# Patient Record
Sex: Male | Born: 1960 | Race: Black or African American | Hispanic: No | Marital: Single | Smoking: Current every day smoker
Health system: Southern US, Community
[De-identification: ages and names within clinical notes are randomized; demographics above are authoritative.]

## PROBLEM LIST (undated history)

## (undated) DIAGNOSIS — L03315 Cellulitis of perineum: Secondary | ICD-10-CM

## (undated) DIAGNOSIS — L02215 Cutaneous abscess of perineum: Secondary | ICD-10-CM

## (undated) DIAGNOSIS — L711 Rhinophyma: Secondary | ICD-10-CM

## (undated) DIAGNOSIS — R972 Elevated prostate specific antigen [PSA]: Secondary | ICD-10-CM

## (undated) DIAGNOSIS — F1020 Alcohol dependence, uncomplicated: Secondary | ICD-10-CM

## (undated) DIAGNOSIS — F1420 Cocaine dependence, uncomplicated: Secondary | ICD-10-CM

## (undated) DIAGNOSIS — W3400XA Accidental discharge from unspecified firearms or gun, initial encounter: Secondary | ICD-10-CM

## (undated) DIAGNOSIS — K603 Anal fistula, unspecified: Secondary | ICD-10-CM

## (undated) DIAGNOSIS — R609 Edema, unspecified: Secondary | ICD-10-CM

## (undated) DIAGNOSIS — F1220 Cannabis dependence, uncomplicated: Secondary | ICD-10-CM

## (undated) DIAGNOSIS — F329 Major depressive disorder, single episode, unspecified: Secondary | ICD-10-CM

## (undated) DIAGNOSIS — F1994 Other psychoactive substance use, unspecified with psychoactive substance-induced mood disorder: Secondary | ICD-10-CM

## (undated) DIAGNOSIS — F32A Depression, unspecified: Secondary | ICD-10-CM

## (undated) DIAGNOSIS — F4310 Post-traumatic stress disorder, unspecified: Secondary | ICD-10-CM

## (undated) DIAGNOSIS — F17200 Nicotine dependence, unspecified, uncomplicated: Secondary | ICD-10-CM

## (undated) HISTORY — DX: Alcohol dependence, uncomplicated: F10.20

## (undated) HISTORY — DX: Edema, unspecified: R60.9

## (undated) HISTORY — DX: Nicotine dependence, unspecified, uncomplicated: F17.200

## (undated) HISTORY — DX: Elevated prostate specific antigen (PSA): R97.20

## (undated) HISTORY — DX: Major depressive disorder, single episode, unspecified: F32.9

## (undated) HISTORY — DX: Other psychoactive substance use, unspecified with psychoactive substance-induced mood disorder: F19.94

## (undated) HISTORY — DX: Cellulitis of perineum: L03.315

## (undated) HISTORY — DX: Rhinophyma: L71.1

## (undated) HISTORY — DX: Cannabis dependence, uncomplicated: F12.20

## (undated) HISTORY — DX: Cutaneous abscess of perineum: L02.215

## (undated) HISTORY — DX: Anal fistula: K60.3

---

## 1998-04-27 HISTORY — PX: OTHER SURGICAL HISTORY: SHX169

## 2000-04-27 HISTORY — PX: KNEE SURGERY: SHX244

## 2004-11-08 ENCOUNTER — Emergency Department: Payer: Self-pay | Admitting: Unknown Physician Specialty

## 2004-11-08 ENCOUNTER — Other Ambulatory Visit: Payer: Self-pay

## 2004-11-25 ENCOUNTER — Emergency Department: Payer: Self-pay | Admitting: Emergency Medicine

## 2004-12-02 ENCOUNTER — Ambulatory Visit: Payer: Self-pay | Admitting: Family Medicine

## 2005-03-10 ENCOUNTER — Emergency Department: Payer: Self-pay | Admitting: Emergency Medicine

## 2005-04-09 ENCOUNTER — Emergency Department: Payer: Self-pay | Admitting: General Practice

## 2005-04-24 ENCOUNTER — Emergency Department: Payer: Self-pay | Admitting: Unknown Physician Specialty

## 2005-08-07 ENCOUNTER — Emergency Department: Payer: Self-pay | Admitting: Emergency Medicine

## 2005-08-18 ENCOUNTER — Emergency Department: Payer: Self-pay | Admitting: Emergency Medicine

## 2008-09-08 ENCOUNTER — Emergency Department: Payer: Self-pay | Admitting: Internal Medicine

## 2013-04-27 HISTORY — PX: RECTAL SURGERY: SHX760

## 2013-12-27 ENCOUNTER — Inpatient Hospital Stay: Payer: Self-pay | Admitting: Psychiatry

## 2013-12-28 LAB — BEHAVIORAL MEDICINE 1 PANEL
Albumin: 3.7 g/dL (ref 3.4–5.0)
Alkaline Phosphatase: 47 U/L
Anion Gap: 6 — ABNORMAL LOW (ref 7–16)
BASOS ABS: 0 10*3/uL (ref 0.0–0.1)
BUN: 9 mg/dL (ref 7–18)
Basophil %: 1 %
Bilirubin,Total: 0.8 mg/dL (ref 0.2–1.0)
CALCIUM: 8.5 mg/dL (ref 8.5–10.1)
CHLORIDE: 104 mmol/L (ref 98–107)
CREATININE: 0.87 mg/dL (ref 0.60–1.30)
Co2: 28 mmol/L (ref 21–32)
EGFR (Non-African Amer.): >60
Eosinophil #: 0.2 10*3/uL (ref 0.0–0.7)
Eosinophil %: 3.5 %
Glucose: 115 mg/dL — ABNORMAL HIGH (ref 65–99)
HCT: 43.2 % (ref 40.0–52.0)
HGB: 14.1 g/dL (ref 13.0–18.0)
LYMPHS ABS: 1.6 10*3/uL (ref 1.0–3.6)
Lymphocyte %: 36.4 %
MCH: 32.7 pg (ref 26.0–34.0)
MCHC: 32.7 g/dL (ref 32.0–36.0)
MCV: 100 fL (ref 80–100)
Monocyte #: 0.3 x10 3/mm (ref 0.2–1.0)
Monocyte %: 6.6 %
NEUTROS ABS: 2.3 10*3/uL (ref 1.4–6.5)
NEUTROS PCT: 52.5 %
Osmolality: 275 (ref 275–301)
Platelet: 231 10*3/uL (ref 150–440)
Potassium: 3.6 mmol/L (ref 3.5–5.1)
RBC: 4.31 10*6/uL — AB (ref 4.40–5.90)
RDW: 13 % (ref 11.5–14.5)
SGOT(AST): 18 U/L (ref 15–37)
SGPT (ALT): 18 U/L
Sodium: 138 mmol/L (ref 136–145)
Thyroid Stimulating Horm: 1.14 u[IU]/mL
Total Protein: 7.3 g/dL (ref 6.4–8.2)
WBC: 4.4 10*3/uL (ref 3.8–10.6)

## 2013-12-28 LAB — URINALYSIS, COMPLETE
BACTERIA: NONE SEEN
BILIRUBIN, UR: NEGATIVE
BLOOD: NEGATIVE
Glucose,UR: NEGATIVE mg/dL (ref 0–75)
KETONE: NEGATIVE
Leukocyte Esterase: NEGATIVE
Nitrite: NEGATIVE
Ph: 6 (ref 4.5–8.0)
Protein: NEGATIVE
RBC,UR: <1 /HPF (ref 0–5)
SQUAMOUS EPITHELIAL: NONE SEEN
Specific Gravity: 1.017 (ref 1.003–1.030)

## 2014-01-26 ENCOUNTER — Emergency Department: Payer: Self-pay | Admitting: Emergency Medicine

## 2014-01-26 LAB — DRUG SCREEN, URINE
Amphetamines, Ur Screen: NEGATIVE (ref ?–1000)
Barbiturates, Ur Screen: NEGATIVE (ref ?–200)
Benzodiazepine, Ur Scrn: NEGATIVE (ref ?–200)
COCAINE METABOLITE, UR ~~LOC~~: POSITIVE (ref ?–300)
Cannabinoid 50 Ng, Ur ~~LOC~~: POSITIVE (ref ?–50)
MDMA (ECSTASY) UR SCREEN: NEGATIVE (ref ?–500)
Methadone, Ur Screen: NEGATIVE (ref ?–300)
Opiate, Ur Screen: NEGATIVE (ref ?–300)
Phencyclidine (PCP) Ur S: NEGATIVE (ref ?–25)
TRICYCLIC, UR SCREEN: NEGATIVE (ref ?–1000)

## 2014-01-26 LAB — SALICYLATE LEVEL: Salicylates, Serum: 2.1 mg/dL

## 2014-01-26 LAB — CBC
HCT: 41.2 % (ref 40.0–52.0)
HGB: 13.3 g/dL (ref 13.0–18.0)
MCH: 32.2 pg (ref 26.0–34.0)
MCHC: 32.3 g/dL (ref 32.0–36.0)
MCV: 100 fL (ref 80–100)
Platelet: 283 10*3/uL (ref 150–440)
RBC: 4.12 10*6/uL — ABNORMAL LOW (ref 4.40–5.90)
RDW: 12.3 % (ref 11.5–14.5)
WBC: 6 10*3/uL (ref 3.8–10.6)

## 2014-01-26 LAB — COMPREHENSIVE METABOLIC PANEL
ALBUMIN: 3.9 g/dL (ref 3.4–5.0)
ALK PHOS: 52 U/L
ALT: 21 U/L
AST: 26 U/L (ref 15–37)
Anion Gap: 8 (ref 7–16)
BILIRUBIN TOTAL: 0.6 mg/dL (ref 0.2–1.0)
BUN: 10 mg/dL (ref 7–18)
CREATININE: 1 mg/dL (ref 0.60–1.30)
Calcium, Total: 8.4 mg/dL — ABNORMAL LOW (ref 8.5–10.1)
Chloride: 102 mmol/L (ref 98–107)
Co2: 30 mmol/L (ref 21–32)
EGFR (African American): >60
Glucose: 92 mg/dL (ref 65–99)
Osmolality: 278 (ref 275–301)
POTASSIUM: 3.7 mmol/L (ref 3.5–5.1)
Sodium: 140 mmol/L (ref 136–145)
TOTAL PROTEIN: 7.6 g/dL (ref 6.4–8.2)

## 2014-01-26 LAB — ACETAMINOPHEN LEVEL: Acetaminophen: <2 ug/mL

## 2014-01-26 LAB — TSH: Thyroid Stimulating Horm: 1.07 u[IU]/mL

## 2014-01-26 LAB — TROPONIN I: Troponin-I: <0.02 ng/mL

## 2014-01-26 LAB — ETHANOL: Ethanol: <3 mg/dL

## 2014-03-06 DIAGNOSIS — F17200 Nicotine dependence, unspecified, uncomplicated: Secondary | ICD-10-CM | POA: Insufficient documentation

## 2014-03-06 DIAGNOSIS — R972 Elevated prostate specific antigen [PSA]: Secondary | ICD-10-CM

## 2014-03-06 HISTORY — DX: Elevated prostate specific antigen (PSA): R97.20

## 2014-03-06 HISTORY — DX: Nicotine dependence, unspecified, uncomplicated: F17.200

## 2014-03-10 ENCOUNTER — Emergency Department: Payer: Self-pay | Admitting: Emergency Medicine

## 2014-03-12 LAB — COMPREHENSIVE METABOLIC PANEL
ALT: 26 U/L
AST: 29 U/L (ref 15–37)
Albumin: 3.7 g/dL (ref 3.4–5.0)
Alkaline Phosphatase: 51 U/L
Anion Gap: 5 — ABNORMAL LOW (ref 7–16)
BILIRUBIN TOTAL: 0.6 mg/dL (ref 0.2–1.0)
BUN: 6 mg/dL — ABNORMAL LOW (ref 7–18)
Calcium, Total: 8.4 mg/dL — ABNORMAL LOW (ref 8.5–10.1)
Chloride: 110 mmol/L — ABNORMAL HIGH (ref 98–107)
Co2: 33 mmol/L — ABNORMAL HIGH (ref 21–32)
Creatinine: 0.99 mg/dL (ref 0.60–1.30)
EGFR (African American): >60
EGFR (Non-African Amer.): >60
GLUCOSE: 85 mg/dL (ref 65–99)
Osmolality: 291 (ref 275–301)
POTASSIUM: 3.7 mmol/L (ref 3.5–5.1)
Sodium: 148 mmol/L — ABNORMAL HIGH (ref 136–145)
Total Protein: 7.6 g/dL (ref 6.4–8.2)

## 2014-03-12 LAB — URINALYSIS, COMPLETE
BLOOD: NEGATIVE
Bacteria: NONE SEEN
Bilirubin,UR: NEGATIVE
Glucose,UR: NEGATIVE mg/dL (ref 0–75)
Ketone: NEGATIVE
LEUKOCYTE ESTERASE: NEGATIVE
NITRITE: NEGATIVE
Ph: 6 (ref 4.5–8.0)
Protein: NEGATIVE
RBC,UR: <1 /HPF (ref 0–5)
SQUAMOUS EPITHELIAL: NONE SEEN
Specific Gravity: 1.001 (ref 1.003–1.030)
WBC UR: <1 /HPF (ref 0–5)

## 2014-03-12 LAB — DRUG SCREEN, URINE

## 2014-03-12 LAB — CBC
HCT: 40.6 % (ref 40.0–52.0)
HGB: 13.6 g/dL (ref 13.0–18.0)
MCH: 32.8 pg (ref 26.0–34.0)
MCHC: 33.4 g/dL (ref 32.0–36.0)
MCV: 98 fL (ref 80–100)
Platelet: 268 10*3/uL (ref 150–440)
RBC: 4.14 10*6/uL — ABNORMAL LOW (ref 4.40–5.90)
RDW: 12.4 % (ref 11.5–14.5)
WBC: 5.1 10*3/uL (ref 3.8–10.6)

## 2014-03-12 LAB — ETHANOL: ETHANOL LVL: 247 mg/dL

## 2014-03-12 LAB — SALICYLATE LEVEL: Salicylates, Serum: <1.7 mg/dL

## 2014-03-12 LAB — ACETAMINOPHEN LEVEL

## 2014-03-13 ENCOUNTER — Inpatient Hospital Stay: Payer: Self-pay | Admitting: Psychiatry

## 2014-03-25 ENCOUNTER — Emergency Department: Payer: Self-pay | Admitting: Internal Medicine

## 2014-04-04 ENCOUNTER — Emergency Department: Payer: Self-pay | Admitting: Emergency Medicine

## 2014-04-05 ENCOUNTER — Emergency Department: Payer: Self-pay | Admitting: Emergency Medicine

## 2014-04-05 LAB — CBC
HCT: 44.2 % (ref 40.0–52.0)
HGB: 14.6 g/dL (ref 13.0–18.0)
MCH: 32.4 pg (ref 26.0–34.0)
MCHC: 33 g/dL (ref 32.0–36.0)
MCV: 98 fL (ref 80–100)
Platelet: 193 10*3/uL (ref 150–440)
RBC: 4.51 10*6/uL (ref 4.40–5.90)
RDW: 12.8 % (ref 11.5–14.5)
WBC: 5.7 10*3/uL (ref 3.8–10.6)

## 2014-04-05 LAB — COMPREHENSIVE METABOLIC PANEL
AST: 25 U/L (ref 15–37)
Albumin: 3.5 g/dL (ref 3.4–5.0)
Alkaline Phosphatase: 43 U/L — ABNORMAL LOW
Anion Gap: 8 (ref 7–16)
BUN: 6 mg/dL — AB (ref 7–18)
Bilirubin,Total: 0.5 mg/dL (ref 0.2–1.0)
CO2: 26 mmol/L (ref 21–32)
CREATININE: 1.04 mg/dL (ref 0.60–1.30)
Calcium, Total: 8.2 mg/dL — ABNORMAL LOW (ref 8.5–10.1)
Chloride: 106 mmol/L (ref 98–107)
EGFR (African American): >60
EGFR (Non-African Amer.): >60
GLUCOSE: 105 mg/dL — AB (ref 65–99)
Osmolality: 277 (ref 275–301)
Potassium: 3.7 mmol/L (ref 3.5–5.1)
SGPT (ALT): 19 U/L
Sodium: 140 mmol/L (ref 136–145)
Total Protein: 7 g/dL (ref 6.4–8.2)

## 2014-04-05 LAB — URINALYSIS, COMPLETE
BILIRUBIN, UR: NEGATIVE
Bacteria: NONE SEEN
Blood: NEGATIVE
GLUCOSE, UR: NEGATIVE mg/dL (ref 0–75)
Ketone: NEGATIVE
Leukocyte Esterase: NEGATIVE
NITRITE: NEGATIVE
PH: 6 (ref 4.5–8.0)
PROTEIN: NEGATIVE
RBC,UR: <1 /HPF (ref 0–5)
Specific Gravity: 1.003 (ref 1.003–1.030)
Squamous Epithelial: NONE SEEN
WBC UR: 1 /HPF (ref 0–5)

## 2014-04-05 LAB — DRUG SCREEN, URINE
Amphetamines, Ur Screen: NEGATIVE (ref ?–1000)
BENZODIAZEPINE, UR SCRN: NEGATIVE (ref ?–200)
Barbiturates, Ur Screen: NEGATIVE (ref ?–200)
COCAINE METABOLITE, UR ~~LOC~~: POSITIVE (ref ?–300)
Cannabinoid 50 Ng, Ur ~~LOC~~: NEGATIVE (ref ?–50)
MDMA (ECSTASY) UR SCREEN: NEGATIVE (ref ?–500)
METHADONE, UR SCREEN: NEGATIVE (ref ?–300)
Opiate, Ur Screen: NEGATIVE (ref ?–300)
PHENCYCLIDINE (PCP) UR S: NEGATIVE (ref ?–25)
Tricyclic, Ur Screen: NEGATIVE (ref ?–1000)

## 2014-04-05 LAB — ACETAMINOPHEN LEVEL: Acetaminophen: <2 ug/mL

## 2014-04-05 LAB — ETHANOL: ETHANOL LVL: 155 mg/dL

## 2014-04-05 LAB — SALICYLATE LEVEL: Salicylates, Serum: <1.7 mg/dL

## 2014-04-20 ENCOUNTER — Emergency Department: Payer: Self-pay | Admitting: Emergency Medicine

## 2014-04-20 LAB — SALICYLATE LEVEL: Salicylates, Serum: 2.4 mg/dL

## 2014-04-20 LAB — CBC
HCT: 40.4 % (ref 40.0–52.0)
HGB: 13.7 g/dL (ref 13.0–18.0)
MCH: 33.1 pg (ref 26.0–34.0)
MCHC: 34 g/dL (ref 32.0–36.0)
MCV: 97 fL (ref 80–100)
PLATELETS: 245 10*3/uL (ref 150–440)
RBC: 4.15 10*6/uL — AB (ref 4.40–5.90)
RDW: 13 % (ref 11.5–14.5)
WBC: 7.9 10*3/uL (ref 3.8–10.6)

## 2014-04-20 LAB — COMPREHENSIVE METABOLIC PANEL
ANION GAP: 9 (ref 7–16)
Albumin: 3.6 g/dL (ref 3.4–5.0)
Alkaline Phosphatase: 46 U/L
BILIRUBIN TOTAL: 0.5 mg/dL (ref 0.2–1.0)
BUN: 11 mg/dL (ref 7–18)
CREATININE: 1.1 mg/dL (ref 0.60–1.30)
Calcium, Total: 8.1 mg/dL — ABNORMAL LOW (ref 8.5–10.1)
Chloride: 107 mmol/L (ref 98–107)
Co2: 24 mmol/L (ref 21–32)
EGFR (African American): >60
EGFR (Non-African Amer.): >60
Glucose: 85 mg/dL (ref 65–99)
OSMOLALITY: 278 (ref 275–301)
POTASSIUM: 3.6 mmol/L (ref 3.5–5.1)
SGOT(AST): 25 U/L (ref 15–37)
SGPT (ALT): 20 U/L
SODIUM: 140 mmol/L (ref 136–145)
TOTAL PROTEIN: 6.8 g/dL (ref 6.4–8.2)

## 2014-04-20 LAB — ACETAMINOPHEN LEVEL: Acetaminophen: <2 ug/mL

## 2014-04-20 LAB — ETHANOL: ETHANOL LVL: 194 mg/dL

## 2014-04-21 LAB — URINALYSIS, COMPLETE
BACTERIA: NONE SEEN
Bilirubin,UR: NEGATIVE
Blood: NEGATIVE
Glucose,UR: NEGATIVE mg/dL (ref 0–75)
Ketone: NEGATIVE
LEUKOCYTE ESTERASE: NEGATIVE
Nitrite: NEGATIVE
PH: 5 (ref 4.5–8.0)
PROTEIN: NEGATIVE
RBC,UR: <1 /HPF (ref 0–5)
Specific Gravity: 1.002 (ref 1.003–1.030)
Squamous Epithelial: NONE SEEN
WBC UR: NONE SEEN /HPF (ref 0–5)

## 2014-04-21 LAB — DRUG SCREEN, URINE
Amphetamines, Ur Screen: NEGATIVE (ref ?–1000)
Barbiturates, Ur Screen: NEGATIVE (ref ?–200)
Benzodiazepine, Ur Scrn: NEGATIVE (ref ?–200)
CANNABINOID 50 NG, UR ~~LOC~~: NEGATIVE (ref ?–50)
COCAINE METABOLITE, UR ~~LOC~~: POSITIVE (ref ?–300)
MDMA (ECSTASY) UR SCREEN: NEGATIVE (ref ?–500)
Methadone, Ur Screen: NEGATIVE (ref ?–300)
Opiate, Ur Screen: NEGATIVE (ref ?–300)
PHENCYCLIDINE (PCP) UR S: NEGATIVE (ref ?–25)
Tricyclic, Ur Screen: NEGATIVE (ref ?–1000)

## 2014-04-27 ENCOUNTER — Emergency Department: Payer: Self-pay | Admitting: Emergency Medicine

## 2014-04-27 LAB — CBC
HCT: 42.2 % (ref 40.0–52.0)
HGB: 14.3 g/dL (ref 13.0–18.0)
MCH: 32.8 pg (ref 26.0–34.0)
MCHC: 33.8 g/dL (ref 32.0–36.0)
MCV: 97 fL (ref 80–100)
Platelet: 301 10*3/uL (ref 150–440)
RBC: 4.35 10*6/uL — AB (ref 4.40–5.90)
RDW: 12.8 % (ref 11.5–14.5)
WBC: 7.6 10*3/uL (ref 3.8–10.6)

## 2014-04-27 LAB — COMPREHENSIVE METABOLIC PANEL
AST: 27 U/L (ref 15–37)
Albumin: 3.8 g/dL (ref 3.4–5.0)
Alkaline Phosphatase: 51 U/L
Anion Gap: 6 — ABNORMAL LOW (ref 7–16)
BUN: 9 mg/dL (ref 7–18)
Bilirubin,Total: 0.3 mg/dL (ref 0.2–1.0)
CALCIUM: 8.3 mg/dL — AB (ref 8.5–10.1)
CO2: 31 mmol/L (ref 21–32)
Chloride: 103 mmol/L (ref 98–107)
Creatinine: 1.25 mg/dL (ref 0.60–1.30)
Glucose: 85 mg/dL (ref 65–99)
Osmolality: 277 (ref 275–301)
POTASSIUM: 3.7 mmol/L (ref 3.5–5.1)
SGPT (ALT): 21 U/L
SODIUM: 140 mmol/L (ref 136–145)
Total Protein: 7.3 g/dL (ref 6.4–8.2)

## 2014-04-27 LAB — ETHANOL: Ethanol: 90 mg/dL

## 2014-04-27 LAB — SALICYLATE LEVEL: Salicylates, Serum: 2.9 mg/dL — ABNORMAL HIGH

## 2014-04-27 LAB — ACETAMINOPHEN LEVEL

## 2014-05-12 ENCOUNTER — Emergency Department: Payer: Self-pay | Admitting: Emergency Medicine

## 2014-05-12 LAB — URINALYSIS, COMPLETE
BILIRUBIN, UR: NEGATIVE
Bacteria: NONE SEEN
Blood: NEGATIVE
GLUCOSE, UR: NEGATIVE mg/dL (ref 0–75)
KETONE: NEGATIVE
LEUKOCYTE ESTERASE: NEGATIVE
Nitrite: NEGATIVE
Ph: 6 (ref 4.5–8.0)
Protein: NEGATIVE
Specific Gravity: 1.003 (ref 1.003–1.030)
Squamous Epithelial: NONE SEEN
WBC UR: 1 /HPF (ref 0–5)

## 2014-05-12 LAB — COMPREHENSIVE METABOLIC PANEL
ALT: 19 U/L
AST: 26 U/L (ref 15–37)
Albumin: 3.4 g/dL (ref 3.4–5.0)
Alkaline Phosphatase: 50 U/L
Anion Gap: 8 (ref 7–16)
BUN: 14 mg/dL (ref 7–18)
Bilirubin,Total: 0.3 mg/dL (ref 0.2–1.0)
Calcium, Total: 8.5 mg/dL (ref 8.5–10.1)
Chloride: 105 mmol/L (ref 98–107)
Co2: 26 mmol/L (ref 21–32)
Creatinine: 1.05 mg/dL (ref 0.60–1.30)
EGFR (African American): >60
EGFR (Non-African Amer.): >60
Glucose: 96 mg/dL (ref 65–99)
Osmolality: 278 (ref 275–301)
Potassium: 3.9 mmol/L (ref 3.5–5.1)
Sodium: 139 mmol/L (ref 136–145)
Total Protein: 6.9 g/dL (ref 6.4–8.2)

## 2014-05-12 LAB — CBC
HCT: 42.9 % (ref 40.0–52.0)
HGB: 14.4 g/dL (ref 13.0–18.0)
MCH: 32.2 pg (ref 26.0–34.0)
MCHC: 33.5 g/dL (ref 32.0–36.0)
MCV: 96 fL (ref 80–100)
PLATELETS: 247 10*3/uL (ref 150–440)
RBC: 4.45 10*6/uL (ref 4.40–5.90)
RDW: 12.5 % (ref 11.5–14.5)
WBC: 6.7 10*3/uL (ref 3.8–10.6)

## 2014-05-12 LAB — DRUG SCREEN, URINE
Amphetamines, Ur Screen: NEGATIVE (ref ?–1000)
BARBITURATES, UR SCREEN: NEGATIVE (ref ?–200)
Benzodiazepine, Ur Scrn: NEGATIVE (ref ?–200)
CANNABINOID 50 NG, UR ~~LOC~~: NEGATIVE (ref ?–50)
COCAINE METABOLITE, UR ~~LOC~~: POSITIVE (ref ?–300)
MDMA (Ecstasy)Ur Screen: NEGATIVE (ref ?–500)
Methadone, Ur Screen: NEGATIVE (ref ?–300)
OPIATE, UR SCREEN: NEGATIVE (ref ?–300)
Phencyclidine (PCP) Ur S: NEGATIVE (ref ?–25)
Tricyclic, Ur Screen: NEGATIVE (ref ?–1000)

## 2014-05-12 LAB — ETHANOL: Ethanol: 158 mg/dL

## 2014-05-13 ENCOUNTER — Emergency Department: Payer: Self-pay | Admitting: Emergency Medicine

## 2014-05-13 LAB — ETHANOL: ETHANOL LVL: 4 mg/dL

## 2014-06-07 ENCOUNTER — Emergency Department: Payer: Self-pay | Admitting: Emergency Medicine

## 2014-08-18 NOTE — Consult Note (Signed)
PATIENT NAME:  Devin Brandt, Devin Brandt MR#:  [id number] DATE OF BIRTH:  1960/06/08  DATE OF CONSULTATION:  04/06/2014  REFERRING PHYSICIAN:   CONSULTING PHYSICIAN:  [name], MD  IDENTIFYING INFORMATION AND REASON FOR CONSULTATION: This is a 54 year old man with a history of substance abuse who presented last night to the Emergency Room intoxicated stating he was depressed and needed detox.   HISTORY OF PRESENT ILLNESS: Information obtained from the chart and the patient. The patient came into the Emergency Room last night. At that time was intoxicated, was saying that he was depressed, felt very negative about himself, thought he needed detox. On evaluation today, the patient has rested and sobered up. He says that he had been sober up until yesterday when he took a hit of cocaine. He had also been drinking yesterday. He felt very bad and negative about himself for that and thought he should come into the hospital and get his "mind straight" before he continued to use. He denied that he had any suicidal ideation at all. Denied homicidal ideation. Denied psychotic symptoms. He says he has been compliant with his prescribed medicine of Remeron and trazodone and goes to [facility] for outpatient treatment. Does not report any specific new stress related to his relapse.   PAST PSYCHIATRIC HISTORY: Long history of substance abuse, alcohol and cocaine most prominently. He had a hospitalization here that ended just in mid to late November. Has had several other hospitalizations in the past under similar circumstances when intoxicated. Denies that he has ever tried to kill himself. Denies ever having had psychotic problems.   FAMILY HISTORY: Has a brother who has mental retardation. Does not know of any other family history.   SOCIAL HISTORY: The patient is on disability. Lives with a friend. He does not do very much during the day. Does not stay in touch with his family. Has no children.   PAST MEDICAL HISTORY: Has  been told he has high blood pressure, but he does not believe it and he does not take his medicine regularly.   SUBSTANCE ABUSE HISTORY: As noted above, history of alcohol and cocaine abuse. No clear history of DTs or seizures. He states that he has never been to an inpatient rehab but he does follow up with outpatient treatment at [facility].   CURRENT MEDICATIONS: Remeron 45 mg at night, trazodone 150 mg at night, Norvasc but he is not compliant with it.   ALLERGIES: KETOROLAC.  REVIEW OF SYSTEMS: Today states that his mood is fine. Denies suicidal ideation. Denies hallucinations. Denies feeling sick to his stomach or tired. In fact the whole review of systems is negative.   MENTAL STATUS EXAMINATION: Disheveled gentleman who looks his stated age who is cooperative with the interview. Eye contact good. Psychomotor activity normal. Speech normal rate, tone and volume. Affect euthymic, reactive, appropriate. Mood stated as all right. Thoughts are lucid without loosening of associations or delusions. Denies auditory or visual hallucinations. Denies suicidal or homicidal ideation. Alert and oriented x4. Can remember 3/3 objects immediately and all of them at 3 minutes. He has good judgment and insight. Normal intelligence.   LABORATORY RESULTS: When he presented last night alcohol level 155. Chemistry panel: Slightly low calcium at 8.2. Alkaline phosphatase 43. Drug screen positive for cocaine.   VITAL SIGNS: Blood pressure here in the Emergency Room most recently is 108/65, pulse 54, temperature 98.1, respirations 18.   ASSESSMENT: A 54 year old man with a history of alcohol abuse and cocaine abuse. Last  night he was intoxicated and depressed. Today, he has sobered up. No signs of alcohol withdrawal. Totally denies suicidality. No sign of psychosis. He has a safe place to live and is already hooked up with outpatient treatment. The patient does not require hospital level treatment.   TREATMENT PLAN: The  patient was given psychoeducation and information about ongoing substance abuse treatment. He agrees to follow up with [facility], both a doctor and going to therapy. He has his own medicine at home. No new prescriptions given. He can be released from the Emergency Room today. The case discussed with the ER doctor.   DIAGNOSIS, PRINCIPAL AND PRIMARY:  AXIS I:  1.  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  2.  Alcohol abuse.  3.  Cocaine abuse.  AXIS II: Deferred.  AXIS III: High blood pressure by history.  ____________________________ Audery AmelJohn T. Clapacs, MD jtc:sb D: 04/06/2014 13:36:32 ET T: 04/06/2014 13:48:38 ET JOB#: [id number]  cc: Audery AmelJohn T. Clapacs, MD, <Dictator> Audery AmelJOHN T CLAPACS MD ELECTRONICALLY SIGNED 04/08/2014 11:19
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
## 2014-08-22 DIAGNOSIS — R609 Edema, unspecified: Secondary | ICD-10-CM | POA: Insufficient documentation

## 2014-08-22 HISTORY — DX: Edema, unspecified: R60.9

## 2014-08-29 ENCOUNTER — Emergency Department
Admission: EM | Admit: 2014-08-29 | Discharge: 2014-08-30 | Disposition: A | Discharge status: Home / Self Care | Payer: MEDICAID | Attending: Emergency Medicine | Admitting: Emergency Medicine

## 2014-08-29 DIAGNOSIS — F1910 Other psychoactive substance abuse, uncomplicated: Secondary | ICD-10-CM

## 2014-08-29 DIAGNOSIS — F1010 Alcohol abuse, uncomplicated: Secondary | ICD-10-CM

## 2014-08-29 DIAGNOSIS — F329 Major depressive disorder, single episode, unspecified: Secondary | ICD-10-CM | POA: Insufficient documentation

## 2014-08-29 DIAGNOSIS — F1410 Cocaine abuse, uncomplicated: Secondary | ICD-10-CM | POA: Insufficient documentation

## 2014-08-29 DIAGNOSIS — F1210 Cannabis abuse, uncomplicated: Secondary | ICD-10-CM | POA: Insufficient documentation

## 2014-08-29 DIAGNOSIS — Z046 Encounter for general psychiatric examination, requested by authority: Secondary | ICD-10-CM | POA: Diagnosis present

## 2014-08-29 LAB — ETHANOL: Alcohol, Ethyl (B): 73 mg/dL — ABNORMAL HIGH (ref <5)

## 2014-08-29 LAB — COMPREHENSIVE METABOLIC PANEL
ALBUMIN: 3.8 g/dL (ref 3.5–5.0)
ALT: 16 U/L — ABNORMAL LOW (ref 17–63)
ANION GAP: 8 (ref 5–15)
AST: 25 U/L (ref 15–41)
Alkaline Phosphatase: 41 U/L (ref 38–126)
BILIRUBIN TOTAL: 0.4 mg/dL (ref 0.3–1.2)
BUN: 11 mg/dL (ref 6–20)
CALCIUM: 8.9 mg/dL (ref 8.9–10.3)
CHLORIDE: 105 mmol/L (ref 101–111)
CO2: 26 mmol/L (ref 22–32)
Creatinine, Ser: 1.2 mg/dL (ref 0.61–1.24)
GFR calc Af Amer: >60 mL/min (ref >60)
Glucose, Bld: 92 mg/dL (ref 65–99)
Potassium: 4.4 mmol/L (ref 3.5–5.1)
Sodium: 139 mmol/L (ref 135–145)
Total Protein: 6.7 g/dL (ref 6.5–8.1)

## 2014-08-29 LAB — CBC
HEMATOCRIT: 43.6 % (ref 40.0–52.0)
Hemoglobin: 14.4 g/dL (ref 13.0–18.0)
MCH: 31.9 pg (ref 26.0–34.0)
MCHC: 33 g/dL (ref 32.0–36.0)
MCV: 96.4 fL (ref 80.0–100.0)
Platelets: 246 10*3/uL (ref 150–440)
RBC: 4.52 MIL/uL (ref 4.40–5.90)
RDW: 13 % (ref 11.5–14.5)
WBC: 3.7 10*3/uL — ABNORMAL LOW (ref 3.8–10.6)

## 2014-08-29 NOTE — ED Notes (Signed)
Pt here for suicidal thoughts and wants detox, from cocaine and alcohol

## 2014-08-29 NOTE — ED Provider Notes (Addendum)
[facility] Emergency Department Provider Note  ____________________________________________  Time seen: 11:20 PM  I have reviewed the triage vital signs and the nursing notes.   HISTORY  Chief Complaint Mental Health Problem    HPI Devin Brandt is a 54 y.o. male who reports a long-term history of alcohol, cocaine, and marijuana abuse. He drinks about a case a day, with his last drink about 2 hours ago. He denies any history of withdrawal symptoms, and denies any kind of hallucinations or seizures in the past. 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 Contrary to the triage note, the patient specifically denies any suicidal ideation, or any other HI, AH, VH. He denies any other symptoms such as chest pain, shortness of breath, abdominal pain, nausea, vomiting.     No past medical history on file.  There are no active problems to display for this patient.   No past surgical history on file.  No current outpatient prescriptions on file.  Allergies Review of patient's allergies indicates no known allergies.  No family history on file.  Social History History  Substance Use Topics  . Smoking status: Not on file  . Smokeless tobacco: Not on file  . Alcohol Use: Not on file    Review of Systems  Constitutional: No fever or chills. No weight changes Eyes:No blurry vision or double vision.  ENT: No sore throat. Cardiovascular: No chest pain. Respiratory: No dyspnea or cough. Gastrointestinal: Negative for abdominal pain, vomiting and diarrhea.  No BRBPR or melena. Genitourinary: Negative for dysuria, urinary retention, bloody urine, or difficulty urinating. Musculoskeletal: Negative for back pain. No joint swelling or pain. Skin: Negative for rash. Neurological: Negative for headaches, focal weakness or numbness. Psychiatric:Depressed  mood Endocrine:No hot/cold intolerance, changes in energy, or sleep difficulty.  10-point ROS otherwise negative.  ____________________________________________   PHYSICAL EXAM:  VITAL SIGNS: ED Triage Vitals  Enc Vitals Group     BP 08/29/14 2213 119/80 mmHg     Pulse Rate 08/29/14 2213 94     Resp --      Temp 08/29/14 2213 98.5 F (36.9 C)     Temp Source 08/29/14 2213 Oral     SpO2 08/29/14 2213 96 %     Weight 08/29/14 2213 165 lb (74.844 kg)     Height 08/29/14 2213 5\' 9"  (1.753 m)     Head Cir --      Peak Flow --      Pain Score --      Pain Loc --      Pain Edu? --      Excl. in GC? --      Constitutional: Alert and oriented. Well appearing and in no distress. Eyes: No scleral icterus. No conjunctival pallor. PERRL. EOMI ENT   Head: Normocephalic and atraumatic.   Nose: No congestion/rhinnorhea. No septal hematoma   Mouth/Throat: MMM, no pharyngeal erythema   Neck: No stridor. No SubQ emphysema.  Hematological/Lymphatic/Immunilogical: No cervical lymphadenopathy. Cardiovascular: RRR. Normal and symmetric distal pulses are present in all extremities. No murmurs, rubs, or gallops. Respiratory: Normal respiratory effort without tachypnea nor retractions. Breath sounds are clear and equal bilaterally. No wheezes/rales/rhonchi. Gastrointestinal: Soft and nontender. No distention. There is no CVA tenderness.  No rebound, rigidity, or guarding. Genitourinary: deferred Musculoskeletal: Nontender with normal range of motion in all extremities. No joint effusions.  No lower extremity tenderness.  No edema. Neurologic:   Normal speech and language.  CN 2-10 normal. Motor grossly intact. No pronator drift.  Normal gait. No gross focal neurologic deficits are appreciated.  Skin:  Skin is warm, dry and intact. No rash noted.  No petechiae, purpura, or bullae. Psychiatric: Depressed mood and affect.Marland Kitchen. Speech and behavior are normal. Patient exhibits appropriate  insight and judgment.  ____________________________________________    LABS (pertinent positives/negatives)  Results for orders placed or performed during the hospital encounter of 08/29/14  CBC  Result Value Ref Range   WBC 3.7 (L) 3.8 - 10.6 K/uL   RBC 4.52 4.40 - 5.90 MIL/uL   Hemoglobin 14.4 13.0 - 18.0 g/dL   HCT 09.843.6 11.940.0 - 14.752.0 %   MCV 96.4 80.0 - 100.0 fL   MCH 31.9 26.0 - 34.0 pg   MCHC 33.0 32.0 - 36.0 g/dL   RDW 82.913.0 56.211.5 - 13.014.5 %   Platelets 246 150 - 440 K/uL  Comprehensive metabolic panel  Result Value Ref Range   Sodium 139 135 - 145 mmol/L   Potassium 4.4 3.5 - 5.1 mmol/L   Chloride 105 101 - 111 mmol/L   CO2 26 22 - 32 mmol/L   Glucose, Bld 92 65 - 99 mg/dL   BUN 11 6 - 20 mg/dL   Creatinine, Ser 8.651.20 0.61 - 1.24 mg/dL   Calcium 8.9 8.9 - 78.410.3 mg/dL   Total Protein 6.7 6.5 - 8.1 g/dL   Albumin 3.8 3.5 - 5.0 g/dL   AST 25 15 - 41 U/L   ALT 16 (L) 17 - 63 U/L   Alkaline Phosphatase 41 38 - 126 U/L   Total Bilirubin 0.4 0.3 - 1.2 mg/dL   GFR calc non Af Amer >60 >60 mL/min   GFR calc Af Amer >60 >60 mL/min   Anion gap 8 5 - 15     ____________________________________________   EKG    ____________________________________________    RADIOLOGY    ____________________________________________   PROCEDURES  ____________________________________________   INITIAL IMPRESSION / ASSESSMENT AND PLAN / ED COURSE  Pertinent labs & imaging results that were available during my care of the patient were reviewed by me and considered in my medical decision making (see chart for details).  The patient has no acute medical complaints, and does not appear to be a safety risk at this time. He is not committable and we'll keep him in the ED voluntarily. I'll consult our behavioral medicine team for detox evaluation. There is no evidence of withdrawal at this time, and based on history, he is low risk for serious withdrawal in near term. He is medically  stable at this time.  ____________________________________________   FINAL CLINICAL IMPRESSION(S) / ED DIAGNOSES  Final diagnoses:  Polysubstance abuse  Alcohol abuse      Sharman CheekPhillip Josejulian Tarango, MD 08/29/14 2339  ----------------------------------------- 3:08 AM on 08/30/2014 -----------------------------------------  Patient's been accepted to [facility] for further treatment. Plan is for him to be picked up at 3:30 AM. The patient remains medically stable. No additional medical complaints. We'll discharge him from the ED to continue detox treatment with [facility].  Sharman CheekPhillip Donis Pinder, MD 08/30/14 [phone]

## 2014-08-30 LAB — URINE DRUG SCREEN, QUALITATIVE (ARMC ONLY)
Amphetamines, Ur Screen: NOT DETECTED
BARBITURATES, UR SCREEN: NOT DETECTED
Benzodiazepine, Ur Scrn: NOT DETECTED
COCAINE METABOLITE, UR ~~LOC~~: POSITIVE — AB
Cannabinoid 50 Ng, Ur ~~LOC~~: POSITIVE — AB
MDMA (Ecstasy)Ur Screen: NOT DETECTED
METHADONE SCREEN, URINE: NOT DETECTED
Opiate, Ur Screen: NOT DETECTED
Phencyclidine (PCP) Ur S: NOT DETECTED
TRICYCLIC, UR SCREEN: NOT DETECTED

## 2014-08-30 LAB — URINALYSIS COMPLETE WITH MICROSCOPIC (ARMC ONLY)
Bacteria, UA: NONE SEEN
Bilirubin Urine: NEGATIVE
GLUCOSE, UA: NEGATIVE mg/dL
Hgb urine dipstick: NEGATIVE
KETONES UR: NEGATIVE mg/dL
Nitrite: NEGATIVE
PROTEIN: NEGATIVE mg/dL
Specific Gravity, Urine: 1.013 (ref 1.005–1.030)
Squamous Epithelial / LPF: NONE SEEN
pH: 5 (ref 5.0–8.0)

## 2014-08-30 NOTE — ED Notes (Signed)
PT came to ER for detox from ETOH and Cocaine. Pt states being depressed d/t relapsing with his previous detox from ETOH and Cocaine. Pt Alert and oriented. Pt denies SI/HI/AH/VH at this time. Pt denies pain at this time. No other medical needs verbalized at this time.

## 2014-08-30 NOTE — ED Notes (Signed)
Pt resting in bed with eyes closed. No unusual behavior observed. Pt has no needs or concerns at this time. Will continue to monitor and f/u as needed.  

## 2014-08-30 NOTE — ED Notes (Signed)
[facility] Pt transferred to ED BHU room 2 accompanied by nurse tech without incident. Pt care assumed. Pt oriented to unit and rules. Pt advised that cameras are present in every room of unit for pt safety. Pt verbalizes understanding.

## 2014-08-30 NOTE — ED Notes (Signed)

## 2014-08-30 NOTE — BH Assessment (Signed)
Assessment Note  Devin MaladyRichard Brandt is an 54 y.o. male, who presents to the ED via the police for c/o, "I called the police to bring me here; I got stressed out; couldn't get straight on my own; I need help; it's gotten out of hand; I'm drinking and using crack cocaine everyday; I have only been sober 1-2 weeks; and i go back with the same crowd and I relapse; I drink a 12 pk and use close to $100.00 in crack cocaine; it's hard for me to do on my own."; denies s.i./h.i.  Axis I: Anxiety Disorder NOS, Depressive Disorder NOS and Substance Abuse Axis II: Deferred Axis III: No past medical history on file. Axis IV: economic problems, occupational problems, other psychosocial or environmental problems and problems with access to health care services Axis V: 61-70 mild symptoms  Past Medical History: No past medical history on file.  No past surgical history on file.  Family History: No family history on file.  Social History:  has no tobacco, alcohol, and drug history on file.  Additional Social History:     CIWA: CIWA-Ar BP: 119/80 mmHg Pulse Rate: 94 COWS:    Allergies: No Known Allergies  Home Medications:  (Not in a hospital admission)  OB/GYN Status:  No LMP for male patient.  General Assessment Data [facility] Assessment: In system Is this a Tele or Face-to-Face Assessment?: Face-to-Face Is this an Initial Assessment or a Re-assessment for this encounter?: Initial Assessment Marital status: Single Is patient pregnant?: No Pregnancy Status: No Living Arrangements: Alone Can pt return to current living arrangement?: Yes Admission Status: Voluntary Is patient capable of signing voluntary admission?: Yes Referral Source: Self/Family/Friend Insurance type: [employer]  Medical Screening Exam [facility] Walk-in ONLY) Medical Exam completed: Yes  Crisis Care Plan Living Arrangements: Alone Name of Psychiatrist: none Name of Therapist:  none  Education Status Is patient currently in school?: No Highest grade of school patient has completed: 12th Contact person: none  Risk to self with the past 6 months Suicidal Ideation: No Has patient been a risk to self within the past 6 months prior to admission? : No Suicidal Intent: No Has patient had any suicidal intent within the past 6 months prior to admission? : No Is patient at risk for suicide?: No Suicidal Plan?: No Has patient had any suicidal plan within the past 6 months prior to admission? : No Access to Means: No What has been your use of drugs/alcohol within the last 12 months?: alcohol and crack cocaine ("I drink about a 12 pk. everyday; and about $50.00-$100.00 i) Previous Attempts/Gestures: No How many times?: 0 Other Self Harm Risks:  (drug use) Triggers for Past Attempts: None known Intentional Self Injurious Behavior: None Family Suicide History: No Recent stressful life event(s): Conflict (Comment) (drug use) Persecutory voices/beliefs?: No Depression: No Depression Symptoms: Feeling worthless/self pity Substance abuse history and/or treatment for substance abuse?: Yes Suicide prevention information given to non-admitted patients: Yes  Risk to Others within the past 6 months Homicidal Ideation: No Does patient have any lifetime risk of violence toward others beyond the six months prior to admission? : No Thoughts of Harm to Others: No Current Homicidal Intent: No Current Homicidal Plan: No Access to Homicidal Means: No Identified Victim: none History of harm to others?: No Assessment of Violence: On admission Violent Behavior Description: none Does patient have access to weapons?: No Criminal Charges Pending?: No Does patient have a court date: No Is patient on  probation?: No  Psychosis Hallucinations: None noted Delusions: None noted  Mental Status Report Appearance/Hygiene: In scrubs Eye Contact: Good Motor Activity:  Unremarkable Speech: Soft, Slow Level of Consciousness: Alert, Quiet/awake Mood: Anxious, Helpless Affect: Sad Anxiety Level: Minimal Thought Processes: Coherent, Circumstantial Judgement: Partial Orientation: Person, Place, Time, Situation Obsessive Compulsive Thoughts/Behaviors: None  Cognitive Functioning Concentration: Good Memory: Recent Intact, Remote Intact IQ: Average Insight: Fair Impulse Control: Good Appetite: Good Weight Loss: 0 Weight Gain: 0 Sleep: Decreased Total Hours of Sleep: 4 (related to drug use) Vegetative Symptoms: None  ADLScreening [facility] Assessment Services) Patient's cognitive ability adequate to safely complete daily activities?: Yes Patient able to express need for assistance with ADLs?: Yes Independently performs ADLs?: Yes (appropriate for developmental age)  Prior Inpatient Therapy Prior Inpatient Therapy: Yes Prior Therapy Dates: [facility]; [facility]; [facility]; dates unknown Prior Therapy Facilty/Provider(s): [facility]; [facility]; [facility] Reason for Treatment: detox; depression  Prior Outpatient Therapy Prior Outpatient Therapy: No Prior Therapy Dates: unknown Prior Therapy Facilty/Provider(s): [facility]; [facility] Reason for Treatment: detox; depression Does patient have an ACCT team?: No Does patient have Intensive In-House Services?  : No Does patient have [facility] services? : No Does patient have P4CC services?: No  ADL Screening (condition at time of admission) Patient's cognitive ability adequate to safely complete daily activities?: Yes Patient able to express need for assistance with ADLs?: Yes Independently performs ADLs?: Yes (appropriate for developmental age)       Abuse/Neglect Assessment (Assessment to be complete while patient is alone) Physical Abuse: Denies Verbal Abuse: Denies Sexual Abuse: Denies Exploitation of patient/patient's resources: Denies Self-Neglect: Denies Values / Beliefs Cultural Requests During Hospitalization:  None Spiritual Requests During Hospitalization: None Consults Spiritual Care Consult Needed: No Social Work Consult Needed: No      Additional Information 1:1 In Past 12 Months?: No CIRT Risk: No Elopement Risk: No Does patient have medical clearance?: Yes  Child/Adolescent Assessment Running Away Risk: Denies Bed-Wetting: Denies Destruction of Property: Denies Cruelty to Animals: Denies Stealing: Denies Rebellious/Defies Authority: Denies  Disposition:  Disposition Initial Assessment Completed for this Encounter: Yes Disposition of Patient: Inpatient treatment program, Referred to ([facility] or [facility]; or [facility]) Type of inpatient treatment program: Adult Patient referred to: [facility]  On Site Evaluation by:   Reviewed with Physician:    Dwan BoltMargaret Socorro Kanitz 08/30/2014 1:21 AM

## 2014-08-30 NOTE — ED Notes (Signed)
BEHAVIORAL HEALTH ROUNDING Patient sleeping: No. Patient alert and oriented: yes Behavior appropriate: Yes.    Nutrition and fluids offered: Yes  Toileting and hygiene offered: Yes  Sitter present: yes Law enforcement present: Yes ODS 

## 2014-08-30 NOTE — ED Notes (Signed)
Patient assigned to appropriate care area. Patient oriented to unit/care area: Informed that, for their safety, care areas are designed for safety and monitored by security cameras at all times; and visiting hours explained to patient. Patient verbalizes understanding, and verbal contract for safety obtained. 

## 2014-08-30 NOTE — ED Notes (Addendum)

## 2014-08-30 NOTE — Discharge Instructions (Signed)
Alcohol Use Disorder °Alcohol use disorder is a mental disorder. It is not a one-time incident of heavy drinking. Alcohol use disorder is the excessive and uncontrollable use of alcohol over time that leads to problems with functioning in one or more areas of daily living. People with this disorder risk harming themselves and others when they drink to excess. Alcohol use disorder also can cause other mental disorders, such as mood and anxiety disorders, and serious physical problems. People with alcohol use disorder often misuse other drugs.  °Alcohol use disorder is common and widespread. Some people with this disorder drink alcohol to cope with or escape from negative life events. Others drink to relieve chronic pain or symptoms of mental illness. People with a family history of alcohol use disorder are at higher risk of losing control and using alcohol to excess.  °SYMPTOMS  °Signs and symptoms of alcohol use disorder may include the following:  °· Consumption of alcohol in larger amounts or over a longer period of time than intended. °· Multiple unsuccessful attempts to cut down or control alcohol use.   °· A great deal of time spent obtaining alcohol, using alcohol, or recovering from the effects of alcohol (hangover). °· A strong desire or urge to use alcohol (cravings).   °· Continued use of alcohol despite problems at work, school, or home because of alcohol use.   °· Continued use of alcohol despite problems in relationships because of alcohol use. °· Continued use of alcohol in situations when it is physically hazardous, such as driving a car. °· Continued use of alcohol despite awareness of a physical or psychological problem that is likely related to alcohol use. Physical problems related to alcohol use can involve the brain, heart, liver, stomach, and intestines. Psychological problems related to alcohol use include intoxication, depression, anxiety, psychosis, delirium, and dementia.   °· The need for  increased amounts of alcohol to achieve the same desired effect, or a decreased effect from the consumption of the same amount of alcohol (tolerance). °· Withdrawal symptoms upon reducing or stopping alcohol use, or alcohol use to reduce or avoid withdrawal symptoms. Withdrawal symptoms include: °· Racing heart. °· Hand tremor. °· Difficulty sleeping. °· Nausea. °· Vomiting. °· Hallucinations. °· Restlessness. °· Seizures. °DIAGNOSIS °Alcohol use disorder is diagnosed through an assessment by your health care provider. Your health care provider may start by asking three or four questions to screen for excessive or problematic alcohol use. To confirm a diagnosis of alcohol use disorder, at least two symptoms must be present within a 12-month period. The severity of alcohol use disorder depends on the number of symptoms: °· Mild--two or three. °· Moderate--four or five. °· Severe--six or more. °Your health care provider may perform a physical exam or use results from lab tests to see if you have physical problems resulting from alcohol use. Your health care provider may refer you to a mental health professional for evaluation. °TREATMENT  °Some people with alcohol use disorder are able to reduce their alcohol use to low-risk levels. Some people with alcohol use disorder need to quit drinking alcohol. When necessary, mental health professionals with specialized training in substance use treatment can help. Your health care provider can help you decide how severe your alcohol use disorder is and what type of treatment you need. The following forms of treatment are available:  °· Detoxification. Detoxification involves the use of prescription medicines to prevent alcohol withdrawal symptoms in the first week after quitting. This is important for people with a history of symptoms   of withdrawal and for heavy drinkers who are likely to have withdrawal symptoms. Alcohol withdrawal can be dangerous and, in severe cases, cause  death. Detoxification is usually provided in a hospital or in-patient substance use treatment facility.  Counseling or talk therapy. Talk therapy is provided by substance use treatment counselors. It addresses the reasons people use alcohol and ways to keep them from drinking again. The goals of talk therapy are to help people with alcohol use disorder find healthy activities and ways to cope with life stress, to identify and avoid triggers for alcohol use, and to handle cravings, which can cause relapse.  Medicines.Different medicines can help treat alcohol use disorder through the following actions:  Decrease alcohol cravings.  Decrease the positive reward response felt from alcohol use.  Produce an uncomfortable physical reaction when alcohol is used (aversion therapy).  Support groups. Support groups are run by people who have quit drinking. They provide emotional support, advice, and guidance. These forms of treatment are often combined. Some people with alcohol use disorder benefit from intensive combination treatment provided by specialized substance use treatment centers. Both inpatient and outpatient treatment programs are available. Document Released: 05/21/2004 Document Revised: 08/28/2013 Document Reviewed: 07/21/2012 [facility] Patient Information 2015 [place], [place]. This information is not intended to replace advice given to you by your health care provider. Make sure you discuss any questions you have with your health care provider.  Polysubstance Abuse When people abuse more than one drug or type of drug it is called polysubstance or polydrug abuse. For example, many smokers also drink alcohol. This is one form of polydrug abuse. Polydrug abuse also refers to the use of a drug to counteract an unpleasant effect produced by another drug. It may also be used to help with withdrawal from another drug. People who take stimulants may become agitated. Sometimes this agitation is  countered with a tranquilizer. This helps protect against the unpleasant side effects. Polydrug abuse also refers to the use of different drugs at the same time.  Anytime drug use is interfering with normal living activities, it has become abuse. This includes problems with family and friends. Psychological dependence has developed when your mind tells you that the drug is needed. This is usually followed by physical dependence which has developed when continuing increases of drug are required to get the same feeling or "high". This is known as addiction or chemical dependency. A person's risk is much higher if there is a history of chemical dependency in the family. SIGNS OF CHEMICAL DEPENDENCY  You have been told by friends or family that drugs have become a problem.  You fight when using drugs.  You are having blackouts (not remembering what you do while using).  You feel sick from using drugs but continue using.  You lie about use or amounts of drugs (chemicals) used.  You need chemicals to get you going.  You are suffering in work performance or in school because of drug use.  You get sick from use of drugs but continue to use anyway.  You need drugs to relate to people or feel comfortable in social situations.  You use drugs to forget problems. "Yes" answered to any of the above signs of chemical dependency indicates there are problems. The longer the use of drugs continues, the greater the problems will become. If there is a family history of drug or alcohol use, it is best not to experiment with these drugs. Continual use leads to tolerance. After tolerance develops  more of the drug is needed to get the same feeling. This is followed by addiction. With addiction, drugs become the most important part of life. It becomes more important to take drugs than participate in the other usual activities of life. This includes relating to friends and family. Addiction is followed by dependency.  Dependency is a condition where drugs are now needed not just to get high, but to feel normal. Addiction cannot be cured but it can be stopped. This often requires outside help and the care of professionals. Treatment centers are listed in the yellow pages under: Cocaine, Narcotics, and Alcoholics Anonymous. Most hospitals and clinics can refer you to a specialized care center. Talk to your caregiver if you need help. Document Released: 12/03/2004 Document Revised: 07/06/2011 Document Reviewed: 04/13/2005 [facility] Patient Information 2015 [place]. This information is not intended to replace advice given to you by your health care provider. Make sure you discuss any questions you have with your health care provider.

## 2014-08-30 NOTE — ED Notes (Signed)
BEHAVIORAL HEALTH ROUNDING Patient sleeping: No. Patient alert and oriented: yes Behavior appropriate: Yes.  ; If no, describe:  Nutrition and fluids offered: Yes  Toileting and hygiene offered: Yes  Sitter present: no Law enforcement present: Yes, ODS 

## 2014-08-30 NOTE — ED Notes (Signed)
BEHAVIORAL HEALTH ROUNDING Patient sleeping: Yes.   Patient alert and oriented: asleep Behavior appropriate: Yes.  ; If no, describe:  Nutrition and fluids offered: asleep Toileting and hygiene offered: asleep Sitter present: no Law enforcement present: Yes, ODS 
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
## 2014-10-01 ENCOUNTER — Telehealth: Payer: Self-pay | Admitting: Gastroenterology

## 2014-10-01 NOTE — Telephone Encounter (Signed)
Call pt wife to set up surg appt for esophagus [phone]

## 2014-10-16 NOTE — Telephone Encounter (Signed)
LVM for someone to return my call to schedule appt.

## 2014-10-23 ENCOUNTER — Emergency Department
Admission: EM | Admit: 2014-10-23 | Discharge: 2014-10-23 | Disposition: A | Discharge status: Home / Self Care | Payer: Medicaid Other | Attending: Emergency Medicine | Admitting: Emergency Medicine

## 2014-10-23 ENCOUNTER — Encounter: Payer: Self-pay | Admitting: Emergency Medicine

## 2014-10-23 DIAGNOSIS — Z720 Tobacco use: Secondary | ICD-10-CM | POA: Insufficient documentation

## 2014-10-23 DIAGNOSIS — K611 Rectal abscess: Secondary | ICD-10-CM | POA: Insufficient documentation

## 2014-10-23 DIAGNOSIS — K6289 Other specified diseases of anus and rectum: Secondary | ICD-10-CM | POA: Diagnosis present

## 2014-10-23 HISTORY — DX: Major depressive disorder, single episode, unspecified: F32.9

## 2014-10-23 HISTORY — DX: Accidental discharge from unspecified firearms or gun, initial encounter: W34.00XA

## 2014-10-23 HISTORY — DX: Post-traumatic stress disorder, unspecified: F43.10

## 2014-10-23 HISTORY — DX: Depression, unspecified: F32.A

## 2014-10-23 MED ORDER — OXYCODONE-ACETAMINOPHEN 5-325 MG PO TABS
ORAL_TABLET | ORAL | Status: AC
  Administered 2014-10-23: 1 via ORAL
  Filled 2014-10-23: qty 1

## 2014-10-23 MED ORDER — OXYCODONE-ACETAMINOPHEN 5-325 MG PO TABS
1.0000 | ORAL_TABLET | Freq: Once | ORAL | Status: AC
  Administered 2014-10-23: 1 via ORAL

## 2014-10-23 MED ORDER — CLINDAMYCIN HCL 150 MG PO CAPS
300.0000 mg | ORAL_CAPSULE | Freq: Once | ORAL | Status: AC
  Administered 2014-10-23: 300 mg via ORAL

## 2014-10-23 MED ORDER — CLINDAMYCIN HCL 150 MG PO CAPS
ORAL_CAPSULE | ORAL | Status: AC
  Administered 2014-10-23: 300 mg via ORAL
  Filled 2014-10-23: qty 2

## 2014-10-23 MED ORDER — CLINDAMYCIN HCL 150 MG PO CAPS: 150.0000 mg | ORAL_CAPSULE | Freq: Four times a day (QID) | ORAL | Status: AC

## 2014-10-23 MED ORDER — OXYCODONE-ACETAMINOPHEN 5-325 MG PO TABS: 1.0000 | ORAL_TABLET | Freq: Four times a day (QID) | ORAL | Status: DC | PRN

## 2014-10-23 NOTE — ED Notes (Addendum)
Rectal pain x3 days , clear  Sticky drainage , had rectal procedure approx 2 years ago for the same , no solid BM for over 1 week, last BM today " it was all water"

## 2014-10-23 NOTE — ED Notes (Signed)
MD at bedside. 
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
## 2014-10-23 NOTE — Discharge Instructions (Signed)
Follow-up with general surgery for further care of the perirectal abscess that you have. Take clindamycin, an antibiotic, to help reduce the infection and inflammation area. Use Percocet if needed for pain control area. Return to the emergency department if your pain worsens, if you have fevers, or give other urgent concerns.   Peri-Rectal Abscess Your caregiver has diagnosed you as having a peri-rectal abscess. This is an infected area near the rectum that is filled with pus. If the abscess is near the surface of the skin, your caregiver may open (incise) the area and drain the pus. HOME CARE INSTRUCTIONS   If your abscess was opened up and drained. A small piece of gauze may be placed in the opening so that it can drain. Do not remove the gauze unless directed by your caregiver.  A loose dressing may be placed over the abscess site. Change the dressing as often as necessary to keep it clean and dry.  After the drain is removed, the area may be washed with a gentle antiseptic (soap) four times per day.  A warm sitz bath, warm packs or heating pad may be used for pain relief, taking care not to burn yourself.  Return for a wound check in 1 day or as directed.  An "inflatable doughnut" may be used for sitting with added comfort. These can be purchased at a drugstore or medical supply house.  To reduce pain and straining with bowel movements, eat a high fiber diet with plenty of fruits and vegetables. Use stool softeners as recommended by your caregiver. This is especially important if narcotic type pain medications were prescribed as these may cause marked constipation.  Only take over-the-counter or prescription medicines for pain, discomfort, or fever as directed by your caregiver. SEEK IMMEDIATE MEDICAL CARE IF:   You have increasing pain that is not controlled by medication.  There is increased inflammation (redness), swelling, bleeding, or drainage from the area.  An oral  temperature above 102 F (38.9 C) develops.  You develop chills or generalized malaise (feel lethargic or feel "washed out").  You develop any new symptoms (problems) you feel may be related to your present problem. Document Released: 04/10/2000 Document Revised: 07/06/2011 Document Reviewed: 04/10/2008 [facility] Patient Information 2015 [employer] [place]. This information is not intended to replace advice given to you by your health care provider. Make sure you discuss any questions you have with your health care provider.

## 2014-10-26 ENCOUNTER — Ambulatory Visit: Payer: Self-pay | Admitting: Surgery

## 2014-11-07 ENCOUNTER — Encounter: Payer: Self-pay | Admitting: *Deleted

## 2014-11-07 DIAGNOSIS — K6289 Other specified diseases of anus and rectum: Secondary | ICD-10-CM | POA: Diagnosis not present

## 2014-11-07 DIAGNOSIS — Z720 Tobacco use: Secondary | ICD-10-CM | POA: Diagnosis not present

## 2014-11-07 NOTE — ED Notes (Signed)
Pt to triage via wheelchair.  Pt has rectal pain for 2 days.  No bleeding.  Pt has itching.  Hx of hemorrhoids.

## 2014-11-07 NOTE — Telephone Encounter (Signed)
LVM again for a return call to schedule.

## 2014-11-08 ENCOUNTER — Emergency Department
Admission: EM | Admit: 2014-11-08 | Discharge: 2014-11-08 | Discharge status: Against Medical Advice | Payer: Medicaid Other | Attending: Emergency Medicine | Admitting: Emergency Medicine

## 2014-11-08 NOTE — ED Notes (Signed)
Pt not in lobby when called

## 2015-02-08 ENCOUNTER — Emergency Department
Admission: EM | Admit: 2015-02-08 | Discharge: 2015-02-09 | Disposition: A | Discharge status: Home / Self Care | Payer: Medicaid Other | Attending: Student | Admitting: Student

## 2015-02-08 DIAGNOSIS — Z720 Tobacco use: Secondary | ICD-10-CM | POA: Insufficient documentation

## 2015-02-08 DIAGNOSIS — F1994 Other psychoactive substance use, unspecified with psychoactive substance-induced mood disorder: Secondary | ICD-10-CM | POA: Insufficient documentation

## 2015-02-08 DIAGNOSIS — F1420 Cocaine dependence, uncomplicated: Secondary | ICD-10-CM

## 2015-02-08 DIAGNOSIS — Z79899 Other long term (current) drug therapy: Secondary | ICD-10-CM | POA: Insufficient documentation

## 2015-02-08 DIAGNOSIS — F1910 Other psychoactive substance abuse, uncomplicated: Secondary | ICD-10-CM

## 2015-02-08 DIAGNOSIS — R45851 Suicidal ideations: Secondary | ICD-10-CM | POA: Insufficient documentation

## 2015-02-08 DIAGNOSIS — Z046 Encounter for general psychiatric examination, requested by authority: Secondary | ICD-10-CM | POA: Diagnosis present

## 2015-02-08 HISTORY — DX: Alcohol dependence, uncomplicated: F10.20

## 2015-02-08 HISTORY — DX: Other psychoactive substance use, unspecified with psychoactive substance-induced mood disorder: F19.94

## 2015-02-08 HISTORY — DX: Cocaine dependence, uncomplicated: F14.20

## 2015-02-08 LAB — COMPREHENSIVE METABOLIC PANEL
ALK PHOS: 43 U/L (ref 38–126)
ALT: 16 U/L — AB (ref 17–63)
AST: 28 U/L (ref 15–41)
Albumin: 3.7 g/dL (ref 3.5–5.0)
Anion gap: 8 (ref 5–15)
BUN: 13 mg/dL (ref 6–20)
CO2: 27 mmol/L (ref 22–32)
CREATININE: 1.24 mg/dL (ref 0.61–1.24)
Calcium: 8.7 mg/dL — ABNORMAL LOW (ref 8.9–10.3)
Chloride: 104 mmol/L (ref 101–111)
GFR calc Af Amer: >60 mL/min (ref >60)
Glucose, Bld: 94 mg/dL (ref 65–99)
Potassium: 3.9 mmol/L (ref 3.5–5.1)
SODIUM: 139 mmol/L (ref 135–145)
Total Bilirubin: 0.4 mg/dL (ref 0.3–1.2)
Total Protein: 6.7 g/dL (ref 6.5–8.1)

## 2015-02-08 LAB — CBC
HCT: 42.2 % (ref 40.0–52.0)
Hemoglobin: 14.3 g/dL (ref 13.0–18.0)
MCH: 32.9 pg (ref 26.0–34.0)
MCHC: 33.9 g/dL (ref 32.0–36.0)
MCV: 97.1 fL (ref 80.0–100.0)
PLATELETS: 229 10*3/uL (ref 150–440)
RBC: 4.35 MIL/uL — ABNORMAL LOW (ref 4.40–5.90)
RDW: 13.5 % (ref 11.5–14.5)
WBC: 5.2 10*3/uL (ref 3.8–10.6)

## 2015-02-08 NOTE — ED Notes (Signed)
Patient here from detox "from everything - alcohol, cocaine everything you've got my records there."  Patient very defensive and not forthcoming with information.

## 2015-02-09 ENCOUNTER — Encounter: Payer: Self-pay | Admitting: Emergency Medicine

## 2015-02-09 DIAGNOSIS — F1994 Other psychoactive substance use, unspecified with psychoactive substance-induced mood disorder: Secondary | ICD-10-CM

## 2015-02-09 DIAGNOSIS — F1020 Alcohol dependence, uncomplicated: Secondary | ICD-10-CM

## 2015-02-09 DIAGNOSIS — F1420 Cocaine dependence, uncomplicated: Secondary | ICD-10-CM

## 2015-02-09 HISTORY — DX: Alcohol dependence, uncomplicated: F10.20

## 2015-02-09 HISTORY — DX: Other psychoactive substance use, unspecified with psychoactive substance-induced mood disorder: F19.94

## 2015-02-09 HISTORY — DX: Cocaine dependence, uncomplicated: F14.20

## 2015-02-09 LAB — URINE DRUG SCREEN, QUALITATIVE (ARMC ONLY)
AMPHETAMINES, UR SCREEN: NOT DETECTED
BARBITURATES, UR SCREEN: NOT DETECTED
BENZODIAZEPINE, UR SCRN: NOT DETECTED
Cannabinoid 50 Ng, Ur ~~LOC~~: POSITIVE — AB
Cocaine Metabolite,Ur ~~LOC~~: NOT DETECTED
MDMA (Ecstasy)Ur Screen: NOT DETECTED
METHADONE SCREEN, URINE: NOT DETECTED
Opiate, Ur Screen: NOT DETECTED
Phencyclidine (PCP) Ur S: NOT DETECTED
TRICYCLIC, UR SCREEN: NOT DETECTED

## 2015-02-09 LAB — SALICYLATE LEVEL

## 2015-02-09 LAB — ETHANOL: Alcohol, Ethyl (B): 201 mg/dL — ABNORMAL HIGH (ref <5)

## 2015-02-09 LAB — ACETAMINOPHEN LEVEL: Acetaminophen (Tylenol), Serum: <10 ug/mL — ABNORMAL LOW (ref 10–30)

## 2015-02-09 MED ORDER — MIRTAZAPINE 15 MG PO TABS: 45.0000 mg | ORAL_TABLET | Freq: Every day | ORAL | Status: DC

## 2015-02-09 MED ORDER — TRAZODONE HCL 100 MG PO TABS: 100.0000 mg | ORAL_TABLET | Freq: Every day | ORAL | Status: DC

## 2015-02-09 NOTE — ED Notes (Signed)
ED BHU PLACEMENT JUSTIFICATION Is the patient under IVC or is there intent for IVC: No. Is the patient medically cleared: Yes.   Is there vacancy in the ED BHU: Yes.   Is the population mix appropriate for patient: Yes.   Is the patient awaiting placement in inpatient or outpatient setting: No. Has the patient had a psychiatric consult: No. Survey of unit performed for contraband, proper placement and condition of furniture, tampering with fixtures in bathroom, shower, and each patient room: Yes.  ; Findings: none APPEARANCE/BEHAVIOR calm, cooperative and adequate rapport can be established NEURO ASSESSMENT Orientation: time, place and person Hallucinations: No.None noted (Hallucinations) Speech: Normal Gait: normal RESPIRATORY ASSESSMENT Normal expansion.  Clear to auscultation.  No rales, rhonchi, or wheezing., No chest wall tenderness., No kyphosis or scoliosis. CARDIOVASCULAR ASSESSMENT regular rate and rhythm, S1, S2 normal, no murmur, click, rub or gallop GASTROINTESTINAL ASSESSMENT soft, nontender, BS WNL, no r/g EXTREMITIES normal strength, tone, and muscle mass PLAN OF CARE Provide calm/safe environment. Vital signs assessed twice daily. ED BHU Assessment once each 12-hour shift. Collaborate with intake RN daily or as condition indicates. Assure the ED provider has rounded once each shift. Provide and encourage hygiene. Provide redirection as needed. Assess for escalating behavior; address immediately and inform ED provider.  Assess family dynamic and appropriateness for visitation as needed: Yes.  ; If necessary, describe findings: none Educate the patient/family about BHU procedures/visitation: Yes.  ; If necessary, describe findings: none

## 2015-02-09 NOTE — BHH Counselor (Signed)
Per request of ER Psych [facility]), writer provided the pt. with information and instructions on how to access Outpatient Mental Health and Substance Abuse Treatment ([facility]).

## 2015-02-09 NOTE — ED Notes (Signed)
Patient resting quietly in room. No noted distress or abnormal behaviors noted. Will continue 15 minute checks and observation by security camera for safety. 

## 2015-02-09 NOTE — ED Notes (Signed)

## 2015-02-09 NOTE — ED Notes (Signed)
BEHAVIORAL HEALTH ROUNDING Patient sleeping: No. Patient alert and oriented: yes Behavior appropriate: Yes.  ; If no, describe:  Nutrition and fluids offered: Yes  Toileting and hygiene offered: Yes  Sitter present: no Law enforcement present: Yes  
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
## 2015-02-09 NOTE — ED Notes (Signed)
Pt states he wants detox for alcohol.  Pt reports last drink was at about 2030 last night.

## 2015-02-09 NOTE — ED Notes (Signed)

## 2015-02-09 NOTE — ED Notes (Signed)
Patient assigned to appropriate care area. Patient oriented to unit/care area: Informed that, for their safety, care areas are designed for safety and monitored by security cameras at all times; and visiting hours explained to patient. Patient verbalizes understanding, and verbal contract for safety obtained.  ED BHU PLACEMENT JUSTIFICATION Is the patient under IVC or is there intent for IVC: No. Is the patient medically cleared: Yes.   Is there vacancy in the ED BHU: Yes.   Is the population mix appropriate for patient: Yes.   Is the patient awaiting placement in inpatient or outpatient setting: No. Has the patient had a psychiatric consult: No. Survey of unit performed for contraband, proper placement and condition of furniture, tampering with fixtures in bathroom, shower, and each patient room: Yes.  ; Findings:  APPEARANCE/BEHAVIOR calm and cooperative NEURO ASSESSMENT Orientation: time, place and person Hallucinations: No.None noted (Hallucinations) Speech: Normal Gait: normal RESPIRATORY ASSESSMENT Normal expansion.  Clear to auscultation.  No rales, rhonchi, or wheezing. CARDIOVASCULAR ASSESSMENT regular rate and rhythm, S1, S2 normal, no murmur, click, rub or gallop GASTROINTESTINAL ASSESSMENT soft, nontender, BS WNL, no r/g EXTREMITIES normal strength, tone, and muscle mass, no deformities, no erythema, induration, or nodules, no evidence of joint effusion PLAN OF CARE Provide calm/safe environment. Vital signs assessed twice daily. ED BHU Assessment once each 12-hour shift. Collaborate with intake RN daily or as condition indicates. Assure the ED provider has rounded once each shift. Provide and encourage hygiene. Provide redirection as needed. Assess for escalating behavior; address immediately and inform ED provider.  Assess family dynamic and appropriateness for visitation as needed: Yes.  ; If necessary, describe findings:  Educate the patient/family about BHU  procedures/visitation: Yes.  ; If necessary, describe findings:   ENVIRONMENTAL ASSESSMENT Potentially harmful objects out of patient reach: Yes.   Personal belongings secured: Yes.   Patient dressed in hospital provided attire only: Yes.   Plastic bags out of patient reach: Yes.   Patient care equipment (cords, cables, call bells, lines, and drains) shortened, removed, or accounted for: Yes.   Equipment and supplies removed from bottom of stretcher: Yes.   Potentially toxic materials out of patient reach: Yes.   Sharps container removed or out of patient reach: Yes.
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
## 2015-02-09 NOTE — ED Provider Notes (Signed)
-----------------------------------------   1:07 PM on 02/09/2015 -----------------------------------------  Case discussed with Dr. Jenne CampusMcQueen of psychiatry who was evaluated the patient and recommends discharge with resources for substance induced mood disorder. The patient reports he does have a place to stay and will be going home. DC with return precautions and PCP follow-up.  Gayla DossEryka A Konnor Vondrasek, MD 02/09/15 [phone]

## 2015-02-09 NOTE — ED Notes (Signed)
Breakfast provided  Appropriate to stimulation  No verbalized needs or concerns at this time  NAD assessed  Continue to monitor 

## 2015-02-09 NOTE — ED Notes (Signed)
Lunch provided along with an extra drink  Pt observed with no unusual behavior  Appropriate to stimulation  No verbalized needs or concerns at this time  NAD assessed  Continue to monitor 

## 2015-02-09 NOTE — ED Notes (Signed)
He is currently on the shower

## 2015-02-09 NOTE — ED Notes (Signed)
1/1 bags of belongings returned to him and he verbalized that he received back all belongings that he came here with

## 2015-02-09 NOTE — ED Notes (Signed)
ED BHU PLACEMENT JUSTIFICATION Is the patient under IVC or is there intent for IVC: NO  Is the patient medically cleared: Yes.   Is there vacancy in the ED BHU: Yes.   Is the population mix appropriate for patient: Yes.   Is the patient awaiting placement in inpatient or outpatient setting: No. Has the patient had a psychiatric consult: No, pending Survey of unit performed for contraband, proper placement and condition of furniture, tampering with fixtures in bathroom, shower, and each patient room: Yes.  ; Findings:  APPEARANCE/BEHAVIOR Calm and cooperative NEURO ASSESSMENT Orientation: oriented x3  Denies pain Hallucinations: No.None noted (Hallucinations) Speech: Normal Gait: normal RESPIRATORY ASSESSMENT even unlabored respirations  CARDIOVASCULAR ASSESSMENT Pulses equal  regular rate  Skin warm and dry GASTROINTESTINAL ASSESSMENT no GI complaint EXTREMITIES Full ROM PLAN OF CARE Provide calm/safe environment. Vital signs assessed twice daily. ED BHU Assessment once each 12-hour shift. Collaborate with intake RN daily or as condition indicates. Assure the ED provider has rounded once each shift. Provide and encourage hygiene. Provide redirection as needed. Assess for escalating behavior; address immediately and inform ED provider.  Assess family dynamic and appropriateness for visitation as needed: Yes.  ; If necessary, describe findings:  Educate the patient/family about BHU procedures/visitation: Yes.  ; If necessary, describe findings:

## 2015-02-09 NOTE — ED Notes (Signed)
BEHAVIORAL HEALTH ROUNDING Patient sleeping: Yes.   Patient alert and oriented: not applicable Behavior appropriate: Yes.  ; If no, describe:  Nutrition and fluids offered: No Toileting and hygiene offered: No Sitter present: q15min rounding, video monitoring Law enforcement present: Yes, ODS   

## 2015-02-09 NOTE — ED Notes (Signed)
Patient observed lying in bed with eyes closed  Even, unlabored respirations observed   NAD pt appears to be sleeping  I will continue to monitor along with every 15 minute visual observations and ongoing security camera monitoring    

## 2015-02-09 NOTE — ED Notes (Signed)
BEHAVIORAL HEALTH ROUNDING Patient sleeping: No. Patient alert and oriented: yes Behavior appropriate: Yes.  ; If no, describe:  Nutrition and fluids offered: yes Toileting and hygiene offered: Yes  Sitter present: q15 minute observations and security camera monitoring Law enforcement present: Yes  ODS  
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
## 2015-02-09 NOTE — ED Notes (Signed)
BEHAVIORAL HEALTH ROUNDING Patient sleeping: Yes.   Patient alert and oriented: eyes closed  Appears asleep Behavior appropriate: Yes.  ; If no, describe:  Nutrition and fluids offered: Yes  Toileting and hygiene offered: sleeping Sitter present: q 15 minute observations and security camera monitoring Law enforcement present: yes  ODS 

## 2015-02-09 NOTE — ED Notes (Signed)
BEHAVIORAL HEALTH ROUNDING Patient sleeping: Yes.   Patient alert and oriented: not applicable Behavior appropriate: Yes.  ; If no, describe:  Nutrition and fluids offered: No Toileting and hygiene offered: No Sitter present: q6515min rounding, video monitoring Law enforcement present: Yes, ODS

## 2015-02-09 NOTE — ED Notes (Addendum)
Discharge and follow up instructions reviewed with him and he verbalized agreement and understanding    A navy colored back pack and a cd player were returned to him while we were exiting through the sally port

## 2015-02-09 NOTE — ED Notes (Signed)
MD Jenne CampusMcQueen is in consulting with him at this time

## 2015-02-19 ENCOUNTER — Encounter: Payer: Self-pay | Admitting: Emergency Medicine

## 2015-02-19 ENCOUNTER — Emergency Department
Admission: EM | Admit: 2015-02-19 | Discharge: 2015-02-20 | Disposition: A | Discharge status: Other Acute Inpatient Hospital | Payer: Medicaid Other | Attending: Student | Admitting: Student

## 2015-02-19 DIAGNOSIS — Z720 Tobacco use: Secondary | ICD-10-CM | POA: Insufficient documentation

## 2015-02-19 DIAGNOSIS — F329 Major depressive disorder, single episode, unspecified: Secondary | ICD-10-CM | POA: Diagnosis not present

## 2015-02-19 DIAGNOSIS — R45851 Suicidal ideations: Secondary | ICD-10-CM | POA: Insufficient documentation

## 2015-02-19 DIAGNOSIS — F1210 Cannabis abuse, uncomplicated: Secondary | ICD-10-CM | POA: Diagnosis not present

## 2015-02-19 DIAGNOSIS — R451 Restlessness and agitation: Secondary | ICD-10-CM | POA: Insufficient documentation

## 2015-02-19 DIAGNOSIS — F333 Major depressive disorder, recurrent, severe with psychotic symptoms: Secondary | ICD-10-CM

## 2015-02-19 DIAGNOSIS — Z046 Encounter for general psychiatric examination, requested by authority: Secondary | ICD-10-CM | POA: Diagnosis present

## 2015-02-19 DIAGNOSIS — F32A Depression, unspecified: Secondary | ICD-10-CM

## 2015-02-19 HISTORY — DX: Major depressive disorder, single episode, unspecified: F32.9

## 2015-02-19 LAB — COMPREHENSIVE METABOLIC PANEL
ALBUMIN: 4.2 g/dL (ref 3.5–5.0)
ALT: 17 U/L (ref 17–63)
ANION GAP: 12 (ref 5–15)
AST: 32 U/L (ref 15–41)
Alkaline Phosphatase: 46 U/L (ref 38–126)
BUN: 12 mg/dL (ref 6–20)
CO2: 23 mmol/L (ref 22–32)
Calcium: 8.9 mg/dL (ref 8.9–10.3)
Chloride: 106 mmol/L (ref 101–111)
Creatinine, Ser: 0.97 mg/dL (ref 0.61–1.24)
GFR calc Af Amer: >60 mL/min (ref >60)
GFR calc non Af Amer: >60 mL/min (ref >60)
Glucose, Bld: 92 mg/dL (ref 65–99)
POTASSIUM: 3.7 mmol/L (ref 3.5–5.1)
Sodium: 141 mmol/L (ref 135–145)
TOTAL PROTEIN: 7.4 g/dL (ref 6.5–8.1)
Total Bilirubin: <0.1 mg/dL — ABNORMAL LOW (ref 0.3–1.2)

## 2015-02-19 LAB — ETHANOL: Alcohol, Ethyl (B): 192 mg/dL — ABNORMAL HIGH (ref <5)

## 2015-02-19 LAB — ACETAMINOPHEN LEVEL

## 2015-02-19 LAB — SALICYLATE LEVEL: Salicylate Lvl: <4 mg/dL (ref 2.8–30.0)

## 2015-02-19 LAB — CBC
HEMATOCRIT: 43 % (ref 40.0–52.0)
Hemoglobin: 14.3 g/dL (ref 13.0–18.0)
MCH: 31.8 pg (ref 26.0–34.0)
MCHC: 33.1 g/dL (ref 32.0–36.0)
MCV: 96 fL (ref 80.0–100.0)
Platelets: 278 10*3/uL (ref 150–440)
RBC: 4.48 MIL/uL (ref 4.40–5.90)
RDW: 13.2 % (ref 11.5–14.5)
WBC: 6 10*3/uL (ref 3.8–10.6)

## 2015-02-19 LAB — URINE DRUG SCREEN, QUALITATIVE (ARMC ONLY)
AMPHETAMINES, UR SCREEN: NOT DETECTED
BENZODIAZEPINE, UR SCRN: NOT DETECTED
Barbiturates, Ur Screen: NOT DETECTED
Cannabinoid 50 Ng, Ur ~~LOC~~: POSITIVE — AB
Cocaine Metabolite,Ur ~~LOC~~: NOT DETECTED
MDMA (Ecstasy)Ur Screen: NOT DETECTED
METHADONE SCREEN, URINE: NOT DETECTED
OPIATE, UR SCREEN: NOT DETECTED
Phencyclidine (PCP) Ur S: NOT DETECTED
Tricyclic, Ur Screen: NOT DETECTED

## 2015-02-19 MED ORDER — TRAZODONE HCL 100 MG PO TABS
100.0000 mg | ORAL_TABLET | Freq: Every day | ORAL | Status: DC
  Administered 2015-02-19: 100 mg via ORAL
  Filled 2015-02-19: qty 1

## 2015-02-19 MED ORDER — ZIPRASIDONE MESYLATE 20 MG IM SOLR
20.0000 mg | Freq: Once | INTRAMUSCULAR | Status: AC
  Administered 2015-02-19: 20 mg via INTRAMUSCULAR
  Filled 2015-02-19: qty 20

## 2015-02-19 MED ORDER — MIRTAZAPINE 15 MG PO TABS
45.0000 mg | ORAL_TABLET | Freq: Every day | ORAL | Status: DC
  Administered 2015-02-19: 45 mg via ORAL
  Filled 2015-02-19: qty 1
  Filled 2015-02-19: qty 3

## 2015-02-19 MED ORDER — LORAZEPAM 2 MG/ML IJ SOLN
2.0000 mg | Freq: Once | INTRAMUSCULAR | Status: AC
  Administered 2015-02-19: 2 mg via INTRAVENOUS
  Filled 2015-02-19: qty 1

## 2015-02-19 NOTE — ED Notes (Signed)
Patient observed lying in bed with eyes closed  Even, unlabored respirations observed   NAD pt appears to be sleeping  I will continue to monitor along with every 15 minute visual observations and ongoing security camera monitoring    
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
## 2015-02-19 NOTE — BHH Counselor (Signed)
First consult attempt unsuccessful- Pt sleeping (geodon/ativan due to aggression)

## 2015-02-19 NOTE — Progress Notes (Signed)
As requested pt has been provided with packet to assist with preparing advanced directives.  02/19/2015 Nicole Knute Mazzuca, MS, NCC, LPCA Therapeutic Triage Specialist  

## 2015-02-19 NOTE — ED Notes (Signed)
ED BHU PLACEMENT JUSTIFICATION Is the patient under IVC or is there intent for IVC: Yes.   Is the patient medically cleared: Yes.   Is there vacancy in the ED BHU: Yes.   Is the population mix appropriate for patient: Yes.   Is the patient awaiting placement in inpatient or outpatient setting: No. Has the patient had a psychiatric consult:  Consult pending Survey of unit performed for contraband, proper placement and condition of furniture, tampering with fixtures in bathroom, shower, and each patient room: Yes.  ; Findings:  APPEARANCE/BEHAVIOR Calm and cooperative NEURO ASSESSMENT Orientation: oriented x3  Denies pain Hallucinations: No.None noted (Hallucinations) Speech: Normal Gait: normal RESPIRATORY ASSESSMENT even unlabored respirations  CARDIOVASCULAR ASSESSMENT Pulses equal  regular rate  Skin warm and dry GASTROINTESTINAL ASSESSMENT no GI complaint EXTREMITIES Full ROM PLAN OF CARE Provide calm/safe environment. Vital signs assessed twice daily. ED BHU Assessment once each 12-hour shift. Collaborate with intake RN daily or as condition indicates. Assure the ED provider has rounded once each shift. Provide and encourage hygiene. Provide redirection as needed. Assess for escalating behavior; address immediately and inform ED provider.  Assess family dynamic and appropriateness for visitation as needed: Yes.  ; If necessary, describe findings:  Educate the patient/family about BHU procedures/visitation: Yes.  ; If necessary, describe findings:   

## 2015-02-19 NOTE — ED Notes (Signed)
Lunch provided along with an extra drink   Assessment completed  He denies pain  Plan of care discussed  Psych consult pending  continue to monitor

## 2015-02-19 NOTE — ED Notes (Signed)

## 2015-02-19 NOTE — ED Notes (Signed)
BEHAVIORAL HEALTH ROUNDING Patient sleeping: Yes.   Patient alert and oriented: eyes closed  Appears asleep Behavior appropriate: Yes.  ; If no, describe:  Nutrition and fluids offered: Yes  Toileting and hygiene offered: sleeping Sitter present: q 15 minute observations and security camera monitoring Law enforcement present: yes  ODS 

## 2015-02-19 NOTE — ED Notes (Signed)
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
## 2015-02-19 NOTE — ED Notes (Signed)
Supper provided along with an extra drink   

## 2015-02-19 NOTE — ED Notes (Signed)
BEHAVIORAL HEALTH ROUNDING Patient sleeping: No. Patient alert and oriented: yes Behavior appropriate: Yes.  ; If no, describe:  Nutrition and fluids offered: yes Toileting and hygiene offered: Yes  Sitter present: q15 minute observations and security camera monitoring Law enforcement present: Yes  ODS  
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
## 2015-02-19 NOTE — ED Notes (Signed)
Breakfast provided   Patient observed lying in bed with eyes closed  Even, unlabored respirations observed   NAD pt appears to be sleeping  I will continue to monitor along with every 15 minute visual observations and ongoing security camera monitoring    

## 2015-02-20 MED ORDER — MIRTAZAPINE 45 MG PO TABS: 45.0000 mg | ORAL_TABLET | Freq: Every day | ORAL | Status: DC

## 2015-02-20 MED ORDER — TRAZODONE HCL 100 MG PO TABS
100.0000 mg | ORAL_TABLET | Freq: Every day | ORAL | Status: DC
Start: 2015-02-20 — End: 2016-01-06

## 2015-02-20 NOTE — ED Notes (Signed)
Patient received meal.  Patient denies SI or HI. States he wants to go home.  Dr. Toni Amendlapacs aware. Maintained on all safety checks.

## 2015-02-20 NOTE — ED Notes (Signed)
Pt. Noted in  room watching the tv.;. No complaints or concerns voiced. No distress or abnormal behavior noted. Will continue to monitor with security cameras. Q 15 minute rounds continue. 

## 2015-02-20 NOTE — ED Notes (Signed)
Pt. Noted in room. No complaints or concerns voiced. No distress or abnormal behavior noted. Will continue to monitor with security cameras. Q 15 minute rounds continue. 

## 2015-02-20 NOTE — [facility] Counselor (Signed)
Per request of Psych  MD (Dr. Clapacs), writer provided the pt. with information and instructions on how to access Outpatient Mental Health & Substance Abuse Treatment ([facility]). 

## 2015-02-20 NOTE — ED Notes (Signed)
Pt. Noted in room resting quietly;. No complaints or concerns voiced. No distress or abnormal behavior noted. Will continue to monitor with security cameras. Q 15 minute rounds continue. 

## 2015-02-20 NOTE — ED Notes (Signed)
Patient stating, "I need a shower; some medicine; two blankets; I been like this all day." (all were provided)

## 2015-02-20 NOTE — ED Provider Notes (Addendum)
-----------------------------------------   7:15 AM on 02/20/2015 -----------------------------------------   Blood pressure  142/89, pulse 70, temperature 97.7 F (36.5 C), temperature source Oral, resp. rate 18, height 5\' 7"  (1.702 m), weight 150 lb (68.04 kg), SpO2 99 %.  The patient had no acute events since last update.  Calm and cooperative at this time.  Disposition is pending per Psychiatry/Behavioral Medicine team recommendations.     Jeanmarie PlantJames A Shaul Trautman, MD 02/20/15 0715   ----------------------------------------- 12:12 PM on 02/20/2015 -----------------------------------------   Seen and evaluated by psychiatry here. They feel the patient is safe for discharge. Patient will follow closely up as an outpatient. No SI or HI and discharge  Jeanmarie PlantJames A Kaydee Magel, MD 02/20/15 1214

## 2015-02-20 NOTE — ED Notes (Signed)
Patient discharged ambulatory to home. He denies SI or HI. Discharge instructions reviewed with patient, he verbalizes understanding. Patient received copy of DC plan and all personal belongings. 

## 2015-02-20 NOTE — ED Notes (Signed)
Report received from AH, RN. Pt. Alert and oriented in no distress denies SI, HI, AVH and pain.  Pt. Instructed to come to me with problems or concerns.Will continue to monitor for safety via security cameras and Q 15 minute checks. 

## 2015-02-20 NOTE — ED Notes (Signed)
Patient asleep in room. No noted distress or abnormal behavior. Will continue 15 minute checks and observation by security cameras for safety. 

## 2015-02-20 NOTE — Discharge Instructions (Signed)
Major Depressive Disorder Major depressive disorder is a mental illness. It also may be called clinical depression or unipolar depression. Major depressive disorder usually causes feelings of sadness, hopelessness, or helplessness. Some people with this disorder do not feel particularly sad but lose interest in doing things they used to enjoy (anhedonia). Major depressive disorder also can cause physical symptoms. It can interfere with work, school, relationships, and other normal everyday activities. The disorder varies in severity but is longer lasting and more serious than the sadness we all feel from time to time in our lives. Major depressive disorder often is triggered by stressful life events or major life changes. Examples of these triggers include divorce, loss of your job or home, a move, and the death of a family member or close friend. Sometimes this disorder occurs for no obvious reason at all. People who have family members with major depressive disorder or bipolar disorder are at higher risk for developing this disorder, with or without life stressors. Major depressive disorder can occur at any age. It may occur just once in your life (single episode major depressive disorder). It may occur multiple times (recurrent major depressive disorder). SYMPTOMS People with major depressive disorder have either anhedonia or depressed mood on nearly a daily basis for at least 2 weeks or longer. Symptoms of depressed mood include:  Feelings of sadness (blue or down in the dumps) or emptiness.  Feelings of hopelessness or helplessness.  Tearfulness or episodes of crying (may be observed by others).  Irritability (children and adolescents). In addition to depressed mood or anhedonia or both, people with this disorder have at least four of the following symptoms:  Difficulty sleeping or sleeping too much.   Significant change (increase or decrease) in appetite or weight.   Lack of energy or  motivation.  Feelings of guilt and worthlessness.   Difficulty concentrating, remembering, or making decisions.  Unusually slow movement (psychomotor retardation) or restlessness (as observed by others).   Recurrent wishes for death, recurrent thoughts of self-harm (suicide), or a suicide attempt. People with major depressive disorder commonly have persistent negative thoughts about themselves, other people, and the world. People with severe major depressive disorder may experiencedistorted beliefs or perceptions about the world (psychotic delusions). They also may see or hear things that are not real (psychotic hallucinations). DIAGNOSIS Major depressive disorder is diagnosed through an assessment by your health care provider. Your health care provider will ask aboutaspects of your daily life, such as mood,sleep, and appetite, to see if you have the diagnostic symptoms of major depressive disorder. Your health care provider may ask about your medical history and use of alcohol or drugs, including prescription medicines. Your health care provider also may do a physical exam and blood work. This is because certain medical conditions and the use of certain substances can cause major depressive disorder-like symptoms (secondary depression). Your health care provider also may refer you to a mental health specialist for further evaluation and treatment. TREATMENT It is important to recognize the symptoms of major depressive disorder and seek treatment. The following treatments can be prescribed for this disorder:   Medicine. Antidepressant medicines usually are prescribed. Antidepressant medicines are thought to correct chemical imbalances in the brain that are commonly associated with major depressive disorder. Other types of medicine may be added if the symptoms do not respond to antidepressant medicines alone or if psychotic delusions or hallucinations occur.  Talk therapy. Talk therapy can be  helpful in treating major depressive disorder by providing   support, education, and guidance. Certain types of talk therapy also can help with negative thinking (cognitive behavioral therapy) and with relationship issues that trigger this disorder (interpersonal therapy). A mental health specialist can help determine which treatment is best for you. Most people with major depressive disorder do well with a combination of medicine and talk therapy. Treatments involving electrical stimulation of the brain can be used in situations with extremely severe symptoms or when medicine and talk therapy do not work over time. These treatments include electroconvulsive therapy, transcranial magnetic stimulation, and vagal nerve stimulation.   This information is not intended to replace advice given to you by your health care provider. Make sure you discuss any questions you have with your health care provider.   Document Released: 08/08/2012 Document Revised: 05/04/2014 Document Reviewed: 08/08/2012 Elsevier Interactive Patient Education 2016 [employer]  

## 2015-02-20 NOTE — Consult Note (Signed)
  Psychiatry: Follow-up for this 54 year old man who was seen yesterday in the emergency room. At that time he was reporting mood disorder psychotic symptoms agitation and possible suicidal ideation. Plan was made for admission to the psychiatry ward. No bed has been available since then. He remains in the emergency room and was reevaluated. Patient interviewed. Chart reviewed. Vital signs reviewed. Case discussed with emergency room doctor.  Patient has no new complaints on review of systems. No specific physical complaints. Denies depression denies psychosis denies suicidal thoughts.  Patient is awake alert oriented and appropriately interactive. Eye contact is normal psychomotor activity normal. Speech normal rate tone and volume. Affect full range reactive appropriate euthymic. Thoughts are lucid without any evidence of loosening of associations or delusions. Denies any hallucinations. Denies suicidal thoughts. Denies feeling depressed. Judgment and insight adequate although I tried to emphasize to him the relationship that the alcohol plays in his mood.  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 His alcohol use disorder is a persistent problem which requires outpatient but not inpatient treatment. Not acutely showing signs of dangerousness withdrawal. Cannabis abuse does not require inpatient treatment both of these are best referred to outpatient counseling. As far as his depression is mood symptoms are all improved today. Doesn't require inpatient treatment. Doesn't meet commitment criteria. Patient will be given prescriptions for his Remeron and trazodone to make sure he still has access to these and will be [facility]up treatment. Counseling to stop abusing alcohol and drugs. Commitment discontinued.

## 2015-02-20 NOTE — ED Notes (Signed)

## 2015-02-20 NOTE — ED Notes (Signed)
Pt. Noted in room eating provided snack;. No complaints or concerns voiced. No distress or abnormal behavior noted. Will continue to monitor with security cameras. Q 15 minute rounds continue.

## 2015-02-20 NOTE — ED Notes (Signed)
Patient resting quietly in room. No noted distress or abnormal behaviors noted. Will continue 15 minute checks and observation by security camera for safety. 

## 2015-04-02 ENCOUNTER — Emergency Department: Payer: Medicaid Other

## 2015-04-02 ENCOUNTER — Emergency Department
Admission: EM | Admit: 2015-04-02 | Discharge: 2015-04-02 | Disposition: A | Discharge status: Home / Self Care | Payer: Medicaid Other | Attending: Emergency Medicine | Admitting: Emergency Medicine

## 2015-04-02 DIAGNOSIS — F17210 Nicotine dependence, cigarettes, uncomplicated: Secondary | ICD-10-CM | POA: Diagnosis not present

## 2015-04-02 DIAGNOSIS — S0990XA Unspecified injury of head, initial encounter: Secondary | ICD-10-CM | POA: Insufficient documentation

## 2015-04-02 DIAGNOSIS — Y92410 Unspecified street and highway as the place of occurrence of the external cause: Secondary | ICD-10-CM | POA: Insufficient documentation

## 2015-04-02 DIAGNOSIS — F10929 Alcohol use, unspecified with intoxication, unspecified: Secondary | ICD-10-CM

## 2015-04-02 DIAGNOSIS — S0003XA Contusion of scalp, initial encounter: Secondary | ICD-10-CM

## 2015-04-02 DIAGNOSIS — F10129 Alcohol abuse with intoxication, unspecified: Secondary | ICD-10-CM | POA: Insufficient documentation

## 2015-04-02 DIAGNOSIS — S4991XA Unspecified injury of right shoulder and upper arm, initial encounter: Secondary | ICD-10-CM | POA: Diagnosis not present

## 2015-04-02 DIAGNOSIS — Y9389 Activity, other specified: Secondary | ICD-10-CM | POA: Diagnosis not present

## 2015-04-02 DIAGNOSIS — R451 Restlessness and agitation: Secondary | ICD-10-CM | POA: Insufficient documentation

## 2015-04-02 DIAGNOSIS — M25511 Pain in right shoulder: Secondary | ICD-10-CM

## 2015-04-02 DIAGNOSIS — M25512 Pain in left shoulder: Secondary | ICD-10-CM

## 2015-04-02 DIAGNOSIS — F1210 Cannabis abuse, uncomplicated: Secondary | ICD-10-CM

## 2015-04-02 DIAGNOSIS — S29012A Strain of muscle and tendon of back wall of thorax, initial encounter: Secondary | ICD-10-CM | POA: Insufficient documentation

## 2015-04-02 DIAGNOSIS — S199XXA Unspecified injury of neck, initial encounter: Secondary | ICD-10-CM | POA: Insufficient documentation

## 2015-04-02 DIAGNOSIS — S4992XA Unspecified injury of left shoulder and upper arm, initial encounter: Secondary | ICD-10-CM | POA: Insufficient documentation

## 2015-04-02 DIAGNOSIS — S39012A Strain of muscle, fascia and tendon of lower back, initial encounter: Secondary | ICD-10-CM

## 2015-04-02 DIAGNOSIS — Z79899 Other long term (current) drug therapy: Secondary | ICD-10-CM | POA: Insufficient documentation

## 2015-04-02 DIAGNOSIS — S3992XA Unspecified injury of lower back, initial encounter: Secondary | ICD-10-CM | POA: Insufficient documentation

## 2015-04-02 DIAGNOSIS — Y998 Other external cause status: Secondary | ICD-10-CM | POA: Diagnosis not present

## 2015-04-02 LAB — URINE DRUG SCREEN, QUALITATIVE (ARMC ONLY)
AMPHETAMINES, UR SCREEN: NOT DETECTED
BARBITURATES, UR SCREEN: NOT DETECTED
BENZODIAZEPINE, UR SCRN: NOT DETECTED
Cannabinoid 50 Ng, Ur ~~LOC~~: POSITIVE — AB
Cocaine Metabolite,Ur ~~LOC~~: NOT DETECTED
MDMA (Ecstasy)Ur Screen: NOT DETECTED
METHADONE SCREEN, URINE: NOT DETECTED
OPIATE, UR SCREEN: NOT DETECTED
Phencyclidine (PCP) Ur S: NOT DETECTED
TRICYCLIC, UR SCREEN: NOT DETECTED

## 2015-04-02 LAB — BASIC METABOLIC PANEL
Anion gap: 7 (ref 5–15)
BUN: 5 mg/dL — AB (ref 6–20)
CALCIUM: 9.1 mg/dL (ref 8.9–10.3)
CHLORIDE: 108 mmol/L (ref 101–111)
CO2: 27 mmol/L (ref 22–32)
CREATININE: 0.96 mg/dL (ref 0.61–1.24)
GFR calc non Af Amer: >60 mL/min (ref >60)
Glucose, Bld: 107 mg/dL — ABNORMAL HIGH (ref 65–99)
Potassium: 3.1 mmol/L — ABNORMAL LOW (ref 3.5–5.1)
SODIUM: 142 mmol/L (ref 135–145)

## 2015-04-02 LAB — CBC
HEMATOCRIT: 39.6 % — AB (ref 40.0–52.0)
Hemoglobin: 13.1 g/dL (ref 13.0–18.0)
MCH: 31.9 pg (ref 26.0–34.0)
MCHC: 33.1 g/dL (ref 32.0–36.0)
MCV: 96.7 fL (ref 80.0–100.0)
Platelets: 240 10*3/uL (ref 150–440)
RBC: 4.1 MIL/uL — ABNORMAL LOW (ref 4.40–5.90)
RDW: 12.8 % (ref 11.5–14.5)
WBC: 4.7 10*3/uL (ref 3.8–10.6)

## 2015-04-02 LAB — TYPE AND SCREEN
ABO/RH(D): O POS
ANTIBODY SCREEN: NEGATIVE

## 2015-04-02 LAB — APTT: aPTT: 31 seconds (ref 24–36)

## 2015-04-02 LAB — ETHANOL: Alcohol, Ethyl (B): 299 mg/dL — ABNORMAL HIGH (ref <5)

## 2015-04-02 LAB — PROTIME-INR
INR: 1.02
PROTHROMBIN TIME: 13.6 s (ref 11.4–15.0)

## 2015-04-02 MED ORDER — LORAZEPAM 2 MG/ML IJ SOLN
1.0000 mg | Freq: Once | INTRAMUSCULAR | Status: AC
Start: 2015-04-02 — End: 2015-04-02
  Administered 2015-04-02: 1 mg via INTRAVENOUS

## 2015-04-02 MED ORDER — SODIUM CHLORIDE 0.9 % IV BOLUS (SEPSIS)
1000.0000 mL | Freq: Once | INTRAVENOUS | Status: AC
  Administered 2015-04-02: 1000 mL via INTRAVENOUS

## 2015-04-02 MED ORDER — LORAZEPAM 2 MG/ML IJ SOLN
INTRAMUSCULAR | Status: AC
  Administered 2015-04-02: 1 mg via INTRAVENOUS
  Filled 2015-04-02: qty 1

## 2015-04-02 MED ORDER — IBUPROFEN 800 MG PO TABS: 800.0000 mg | ORAL_TABLET | Freq: Three times a day (TID) | ORAL | Status: DC | PRN

## 2015-04-02 NOTE — ED Notes (Signed)
Pt currently asleep.

## 2015-04-02 NOTE — ED Notes (Signed)
Pt able to ambulate to BR by self.  Pt continues to express desire to leave.  Pt calls [facility] "fucking bunch of bitches" and when asked if he feels ready to leave sts "you motherfucking...need to get down the road"
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
## 2015-04-02 NOTE — ED Notes (Addendum)
Pt unable to ambulate w/o 2 man assistance. MD Sharma CovertNorman informed.

## 2015-04-02 NOTE — ED Notes (Signed)
Pt able to stand but unable to ambulate independently.

## 2015-04-02 NOTE — Discharge Instructions (Signed)
For pain, he may take Tylenol or Motrin. Please drink in moderation and do not use any illicit substances.   Please return to the emergency department if you develop severe headache, changes in mental status, nausea or vomiting, numbness tingling or weakness, inability to walk, or any other symptoms concerning to you.
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
## 2015-04-02 NOTE — ED Notes (Signed)
Patient transported to X-ray 

## 2015-04-03 LAB — ABO/RH: ABO/RH(D): O POS

## 2015-04-04 ENCOUNTER — Emergency Department
Admission: EM | Admit: 2015-04-04 | Discharge: 2015-04-04 | Disposition: A | Discharge status: Home / Self Care | Payer: MEDICAID

## 2015-04-11 DIAGNOSIS — T148XXA Other injury of unspecified body region, initial encounter: Secondary | ICD-10-CM | POA: Insufficient documentation

## 2015-04-14 ENCOUNTER — Other Ambulatory Visit: Payer: Self-pay

## 2015-04-14 ENCOUNTER — Encounter: Payer: Self-pay | Admitting: Emergency Medicine

## 2015-04-14 ENCOUNTER — Emergency Department
Admission: EM | Admit: 2015-04-14 | Discharge: 2015-04-14 | Discharge status: Against Medical Advice | Payer: Medicaid Other | Attending: Emergency Medicine | Admitting: Emergency Medicine

## 2015-04-14 DIAGNOSIS — Z79899 Other long term (current) drug therapy: Secondary | ICD-10-CM | POA: Diagnosis not present

## 2015-04-14 DIAGNOSIS — Z043 Encounter for examination and observation following other accident: Secondary | ICD-10-CM | POA: Insufficient documentation

## 2015-04-14 DIAGNOSIS — W01198A Fall on same level from slipping, tripping and stumbling with subsequent striking against other object, initial encounter: Secondary | ICD-10-CM | POA: Diagnosis not present

## 2015-04-14 DIAGNOSIS — F17210 Nicotine dependence, cigarettes, uncomplicated: Secondary | ICD-10-CM | POA: Insufficient documentation

## 2015-04-14 DIAGNOSIS — R454 Irritability and anger: Secondary | ICD-10-CM

## 2015-04-14 DIAGNOSIS — Y998 Other external cause status: Secondary | ICD-10-CM | POA: Insufficient documentation

## 2015-04-14 DIAGNOSIS — R55 Syncope and collapse: Secondary | ICD-10-CM | POA: Diagnosis present

## 2015-04-14 DIAGNOSIS — Y9389 Activity, other specified: Secondary | ICD-10-CM | POA: Diagnosis not present

## 2015-04-14 DIAGNOSIS — Y9289 Other specified places as the place of occurrence of the external cause: Secondary | ICD-10-CM | POA: Insufficient documentation

## 2015-04-14 LAB — URINALYSIS COMPLETE WITH MICROSCOPIC (ARMC ONLY)
Bacteria, UA: NONE SEEN
Bilirubin Urine: NEGATIVE
Glucose, UA: NEGATIVE mg/dL
HGB URINE DIPSTICK: NEGATIVE
KETONES UR: NEGATIVE mg/dL
LEUKOCYTES UA: NEGATIVE
NITRITE: NEGATIVE
PH: 6 (ref 5.0–8.0)
PROTEIN: NEGATIVE mg/dL
RBC / HPF: NONE SEEN RBC/hpf (ref 0–5)
SPECIFIC GRAVITY, URINE: 1.002 — AB (ref 1.005–1.030)
Squamous Epithelial / LPF: NONE SEEN
WBC UA: NONE SEEN WBC/hpf (ref 0–5)

## 2015-04-14 LAB — COMPREHENSIVE METABOLIC PANEL
ALT: 13 U/L — AB (ref 17–63)
AST: 21 U/L (ref 15–41)
Albumin: 4.2 g/dL (ref 3.5–5.0)
Alkaline Phosphatase: 46 U/L (ref 38–126)
Anion gap: 7 (ref 5–15)
BUN: 9 mg/dL (ref 6–20)
CHLORIDE: 111 mmol/L (ref 101–111)
CO2: 26 mmol/L (ref 22–32)
CREATININE: 1.01 mg/dL (ref 0.61–1.24)
Calcium: 9.1 mg/dL (ref 8.9–10.3)
Glucose, Bld: 66 mg/dL (ref 65–99)
Potassium: 3 mmol/L — ABNORMAL LOW (ref 3.5–5.1)
Sodium: 144 mmol/L (ref 135–145)
Total Bilirubin: 0.6 mg/dL (ref 0.3–1.2)
Total Protein: 7.8 g/dL (ref 6.5–8.1)

## 2015-04-14 LAB — CBC
HCT: 39.3 % — ABNORMAL LOW (ref 40.0–52.0)
HEMOGLOBIN: 13.2 g/dL (ref 13.0–18.0)
MCH: 32.8 pg (ref 26.0–34.0)
MCHC: 33.7 g/dL (ref 32.0–36.0)
MCV: 97.3 fL (ref 80.0–100.0)
Platelets: 262 10*3/uL (ref 150–440)
RBC: 4.04 MIL/uL — AB (ref 4.40–5.90)
RDW: 13.3 % (ref 11.5–14.5)
WBC: 4.8 10*3/uL (ref 3.8–10.6)

## 2015-04-14 LAB — ETHANOL: ALCOHOL ETHYL (B): 171 mg/dL — AB (ref <5)

## 2015-04-14 NOTE — ED Notes (Addendum)
Pt AAox3. Clear speech. Currently asymptomatic.  States he wants to leave and refuse any other treatment despite MD attempts at re-assuring patient to stay for further eval.   Ambulates with steady gait.  Ate >75% of sandwich and snacks.  AMA signed. Escorted to waiting room. States he will call his roommate to come pick him up or grab a taxi.
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
## 2015-04-15 ENCOUNTER — Other Ambulatory Visit: Payer: Self-pay

## 2015-05-27 DIAGNOSIS — L711 Rhinophyma: Secondary | ICD-10-CM | POA: Insufficient documentation

## 2015-05-27 HISTORY — DX: Rhinophyma: L71.1

## 2015-06-16 ENCOUNTER — Emergency Department
Admission: EM | Admit: 2015-06-16 | Discharge: 2015-06-16 | Disposition: A | Discharge status: Home / Self Care | Payer: Medicaid Other | Attending: Emergency Medicine | Admitting: Emergency Medicine

## 2015-06-16 ENCOUNTER — Encounter: Payer: Self-pay | Admitting: Emergency Medicine

## 2015-06-16 DIAGNOSIS — F17210 Nicotine dependence, cigarettes, uncomplicated: Secondary | ICD-10-CM | POA: Insufficient documentation

## 2015-06-16 DIAGNOSIS — Z79899 Other long term (current) drug therapy: Secondary | ICD-10-CM | POA: Insufficient documentation

## 2015-06-16 DIAGNOSIS — F1020 Alcohol dependence, uncomplicated: Secondary | ICD-10-CM | POA: Insufficient documentation

## 2015-06-16 DIAGNOSIS — R45851 Suicidal ideations: Secondary | ICD-10-CM | POA: Diagnosis present

## 2015-06-16 NOTE — ED Notes (Addendum)
Pt verbally aggressive, cussing, not cooperative. Advised since he is here voluntarily for detox he could accept our help and follow our rules or if he continued. Denies suicidal thoughts or plans, states "just get out of my face you cunt" - "i dont need to give you any reason" (in question as to why he is here),
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
## 2015-06-16 NOTE — Discharge Instructions (Signed)

## 2015-06-16 NOTE — ED Notes (Addendum)
Pt. Upset upon leaving. Given number for rts and [facility]

## 2015-06-16 NOTE — ED Notes (Signed)
Per EMS, patient comes from home with suicidal thoughts. Patient is also wanting to detox from alcohol. Patient states he drank two 40 oz of beer and 9 bottles of wine. Patient also states he smoked 7 blunts of marijuana. Patient is A&O x4. Patient words are slurred in triage.
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
## 2015-08-15 ENCOUNTER — Encounter: Payer: Self-pay | Admitting: General Surgery

## 2015-08-15 ENCOUNTER — Ambulatory Visit (INDEPENDENT_AMBULATORY_CARE_PROVIDER_SITE_OTHER): Payer: Medicaid Other | Admitting: General Surgery

## 2015-08-15 ENCOUNTER — Telehealth: Payer: Self-pay

## 2015-08-15 VITALS — BP 126/72 | HR 62 | Temp 98.0°F | Ht 70.0 in | Wt 156.2 lb

## 2015-08-15 DIAGNOSIS — K612 Anorectal abscess: Secondary | ICD-10-CM | POA: Diagnosis not present

## 2015-08-15 MED ORDER — TRAMADOL HCL 50 MG PO TABS: 50.0000 mg | ORAL_TABLET | Freq: Four times a day (QID) | ORAL | Status: DC | PRN
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
## 2015-08-15 NOTE — Telephone Encounter (Signed)
[facility] for Peri-rectal abscess with possible Colo-cutaneous Fistula. [facility] has been [facility] before according to patient but I do not know how long ago.

## 2015-08-15 NOTE — Patient Instructions (Signed)
You will need to see [facility]. We will send a new referral over today and you will be notified by their office with an appointment. If you do not hear from [facility] by the end of next week, let our office know.  We have given you a prescription for pain medication today. You will take this medication - 1 tablet every 6 hours as needed for pain.

## 2015-08-15 NOTE — Telephone Encounter (Signed)
I have called [facility] colorectal surgery, no answer. I have left a detailed message on voicemail. [phone].

## 2015-08-19 NOTE — Telephone Encounter (Signed)
I have spoke with Darcel SmallingNancy Long and informed her of patient's referral to [facility].  Appointment: 09/02/15 @ 9:00am with Dr. Elwin [facility]umont at [facility]. [phone] was given if patient needed to reschedule.

## 2015-12-25 ENCOUNTER — Emergency Department: Payer: Medicaid Other

## 2015-12-25 ENCOUNTER — Encounter: Payer: Self-pay | Admitting: Emergency Medicine

## 2015-12-25 ENCOUNTER — Emergency Department
Admission: EM | Admit: 2015-12-25 | Discharge: 2015-12-25 | Disposition: A | Discharge status: Home / Self Care | Payer: Medicaid Other | Attending: Emergency Medicine | Admitting: Emergency Medicine

## 2015-12-25 DIAGNOSIS — F10129 Alcohol abuse with intoxication, unspecified: Secondary | ICD-10-CM | POA: Diagnosis not present

## 2015-12-25 DIAGNOSIS — R45851 Suicidal ideations: Secondary | ICD-10-CM | POA: Diagnosis not present

## 2015-12-25 DIAGNOSIS — M25511 Pain in right shoulder: Secondary | ICD-10-CM | POA: Insufficient documentation

## 2015-12-25 DIAGNOSIS — F1994 Other psychoactive substance use, unspecified with psychoactive substance-induced mood disorder: Secondary | ICD-10-CM | POA: Insufficient documentation

## 2015-12-25 DIAGNOSIS — F17210 Nicotine dependence, cigarettes, uncomplicated: Secondary | ICD-10-CM | POA: Insufficient documentation

## 2015-12-25 DIAGNOSIS — M542 Cervicalgia: Secondary | ICD-10-CM | POA: Diagnosis not present

## 2015-12-25 DIAGNOSIS — M25512 Pain in left shoulder: Secondary | ICD-10-CM | POA: Diagnosis not present

## 2015-12-25 DIAGNOSIS — F10920 Alcohol use, unspecified with intoxication, uncomplicated: Secondary | ICD-10-CM

## 2015-12-25 LAB — URINE DRUG SCREEN, QUALITATIVE (ARMC ONLY)
Amphetamines, Ur Screen: NOT DETECTED
Barbiturates, Ur Screen: NOT DETECTED
Benzodiazepine, Ur Scrn: NOT DETECTED
CANNABINOID 50 NG, UR ~~LOC~~: POSITIVE — AB
COCAINE METABOLITE, UR ~~LOC~~: NOT DETECTED
MDMA (ECSTASY) UR SCREEN: NOT DETECTED
Methadone Scn, Ur: NOT DETECTED
OPIATE, UR SCREEN: NOT DETECTED
PHENCYCLIDINE (PCP) UR S: NOT DETECTED
Tricyclic, Ur Screen: NOT DETECTED

## 2015-12-25 LAB — BASIC METABOLIC PANEL
Anion gap: 8 (ref 5–15)
BUN: 8 mg/dL (ref 6–20)
CALCIUM: 9 mg/dL (ref 8.9–10.3)
CHLORIDE: 105 mmol/L (ref 101–111)
CO2: 28 mmol/L (ref 22–32)
CREATININE: 0.86 mg/dL (ref 0.61–1.24)
GFR calc non Af Amer: >60 mL/min (ref >60)
GLUCOSE: 109 mg/dL — AB (ref 65–99)
Potassium: 3.9 mmol/L (ref 3.5–5.1)
Sodium: 141 mmol/L (ref 135–145)

## 2015-12-25 LAB — CBC
HEMATOCRIT: 41.2 % (ref 40.0–52.0)
HEMOGLOBIN: 14.3 g/dL (ref 13.0–18.0)
MCH: 33.7 pg (ref 26.0–34.0)
MCHC: 34.6 g/dL (ref 32.0–36.0)
MCV: 97.2 fL (ref 80.0–100.0)
Platelets: 241 10*3/uL (ref 150–440)
RBC: 4.24 MIL/uL — ABNORMAL LOW (ref 4.40–5.90)
RDW: 12.7 % (ref 11.5–14.5)
WBC: 6.1 10*3/uL (ref 3.8–10.6)

## 2015-12-25 LAB — SALICYLATE LEVEL: Salicylate Lvl: <4 mg/dL (ref 2.8–30.0)

## 2015-12-25 LAB — ETHANOL: ALCOHOL ETHYL (B): 260 mg/dL — AB (ref <5)

## 2015-12-25 LAB — ACETAMINOPHEN LEVEL: Acetaminophen (Tylenol), Serum: <10 ug/mL — ABNORMAL LOW (ref 10–30)

## 2015-12-25 MED ORDER — PANTOPRAZOLE SODIUM 40 MG PO TBEC
40.0000 mg | DELAYED_RELEASE_TABLET | ORAL | Status: AC
  Administered 2015-12-25: 40 mg via ORAL
  Filled 2015-12-25: qty 1

## 2015-12-25 MED ORDER — IBUPROFEN 800 MG PO TABS
800.0000 mg | ORAL_TABLET | ORAL | Status: AC
  Administered 2015-12-25: 800 mg via ORAL
  Filled 2015-12-25: qty 1

## 2015-12-25 MED ORDER — KETOROLAC TROMETHAMINE 60 MG/2ML IM SOLN
60.0000 mg | Freq: Once | INTRAMUSCULAR | Status: AC
  Administered 2015-12-25: 60 mg via INTRAMUSCULAR
  Filled 2015-12-25: qty 2

## 2015-12-25 NOTE — ED Notes (Signed)
BEHAVIORAL HEALTH ROUNDING  Patient sleeping: Yes Patient alert and oriented: Sleeping Behavior appropriate: Yes. ; If no, describe:  Nutrition and fluids offered: No, sleeping  Toileting and hygiene offered: No, sleeping  Sitter present: q15 minute observations and security monitoring  Law enforcement present: Yes ODS 
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
## 2015-12-25 NOTE — ED Notes (Signed)
While this RN was standing in room, pt keeping up steady stream of verbally abusive words towards this RN. Pt then tried to wrap blood pressure cord around his neck stating, "If you're not going to give me a shot of poison I'm just going to fucking kill myself you bitch." Cord immediately taken away from pt and pt was still wearing c-collar so was unable to get it around his neck. Pt then started to yell at this RN stating, "You don't know what I've been through you bitch. I just want to die!" Charge RN notified of this. Pam from CT came to take pt to CT. This RN explained to pt that if he went to CT we would be able to take the c-collar off, which pt has been complaining about. Pt then started to yell about several things. [employer] officer called from front. Pt stopped yelling. This RN again explained that we wanted to take him to CT so that we could make sure that he didn't have a head or neck injury. Pt then agreed to go to CT. Pt taken to CT by Pam and escorted by [employer] officer. MD notified of incident with BP cord and MD informed this RN that IVC papers would be taken out.

## 2015-12-25 NOTE — ED Notes (Signed)
Devin Brandt of [facility] came to visit pt. Mr. Devin Brandt stated his agency assisted pt with activities of daily living, housing, financial matters and transportation assistance. Mr. Devin Brandt can be reached at [phone].

## 2015-12-25 NOTE — Discharge Instructions (Signed)

## 2015-12-25 NOTE — ED Notes (Signed)
This RN went in to give pt ordered IM toradol. This RN told pt what we were giving him and pt stated, "Fucking toradol? I need something stronger than that shit. I'm fucking hurting man." This RN asked the patient if he still wanted it. Pt grunted. RN asked again if he wanted it.  Pt then stated, "Yes, just give it to me. Fucking kill me man. Whatever. I want to die." RN administered ordered Im toradol while pt rambled about how he wanted to die and about how he has medicaid and isn't looking for any handouts. C-collar removed per MD order.
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
## 2015-12-25 NOTE — ED Notes (Signed)
Pt refusing to let this RN draw blood.

## 2015-12-25 NOTE — ED Provider Notes (Signed)
-----------------------------------------   1:56 PM on 12/25/2015 -----------------------------------------   BP 118/79 (BP Location: Right Arm)   Pulse 89   Temp 98.3 F (36.8 C) (Oral)   Resp 19   Ht 5\' 9"  (1.753 m)   Wt 68 kg   SpO2 98%   BMI 22.15 kg/m   I spoke in person with Dr. Toni Amendlapacs who evaluated the patient and feels that the patient is safe to be discharged with outpatient follow-up.  The patient is hemodynamically stable and appropriate to go at this time.    Loleta Roseory Millena Callins, MD 12/25/15 1356

## 2015-12-25 NOTE — ED Notes (Signed)
Pt given phone to call one of his care providers.

## 2016-01-01 ENCOUNTER — Inpatient Hospital Stay
Admit: 2016-01-01 | Discharge: 2016-01-06 | DRG: 885 | Disposition: A | Discharge status: Home / Self Care | Payer: Medicaid Other | Attending: Psychiatry | Admitting: Psychiatry

## 2016-01-01 ENCOUNTER — Emergency Department
Admission: EM | Admit: 2016-01-01 | Discharge: 2016-01-01 | Disposition: A | Discharge status: Other Acute Inpatient Hospital | Payer: Medicaid Other | Attending: Emergency Medicine | Admitting: Emergency Medicine

## 2016-01-01 ENCOUNTER — Encounter: Payer: Self-pay | Admitting: Emergency Medicine

## 2016-01-01 DIAGNOSIS — F1490 Cocaine use, unspecified, uncomplicated: Secondary | ICD-10-CM | POA: Insufficient documentation

## 2016-01-01 DIAGNOSIS — G4700 Insomnia, unspecified: Secondary | ICD-10-CM | POA: Diagnosis present

## 2016-01-01 DIAGNOSIS — F4312 Post-traumatic stress disorder, chronic: Secondary | ICD-10-CM | POA: Diagnosis present

## 2016-01-01 DIAGNOSIS — F1994 Other psychoactive substance use, unspecified with psychoactive substance-induced mood disorder: Secondary | ICD-10-CM | POA: Diagnosis not present

## 2016-01-01 DIAGNOSIS — F17210 Nicotine dependence, cigarettes, uncomplicated: Secondary | ICD-10-CM | POA: Diagnosis not present

## 2016-01-01 DIAGNOSIS — F10229 Alcohol dependence with intoxication, unspecified: Secondary | ICD-10-CM | POA: Diagnosis present

## 2016-01-01 DIAGNOSIS — M25511 Pain in right shoulder: Secondary | ICD-10-CM | POA: Diagnosis present

## 2016-01-01 DIAGNOSIS — R45851 Suicidal ideations: Secondary | ICD-10-CM | POA: Diagnosis present

## 2016-01-01 DIAGNOSIS — F1290 Cannabis use, unspecified, uncomplicated: Secondary | ICD-10-CM | POA: Insufficient documentation

## 2016-01-01 DIAGNOSIS — F329 Major depressive disorder, single episode, unspecified: Secondary | ICD-10-CM | POA: Insufficient documentation

## 2016-01-01 DIAGNOSIS — F1420 Cocaine dependence, uncomplicated: Secondary | ICD-10-CM | POA: Diagnosis present

## 2016-01-01 DIAGNOSIS — Y908 Blood alcohol level of 240 mg/100 ml or more: Secondary | ICD-10-CM | POA: Diagnosis present

## 2016-01-01 DIAGNOSIS — Z79899 Other long term (current) drug therapy: Secondary | ICD-10-CM

## 2016-01-01 DIAGNOSIS — Z791 Long term (current) use of non-steroidal anti-inflammatories (NSAID): Secondary | ICD-10-CM | POA: Insufficient documentation

## 2016-01-01 DIAGNOSIS — F32A Depression, unspecified: Secondary | ICD-10-CM

## 2016-01-01 DIAGNOSIS — Z9889 Other specified postprocedural states: Secondary | ICD-10-CM | POA: Diagnosis not present

## 2016-01-01 DIAGNOSIS — F331 Major depressive disorder, recurrent, moderate: Principal | ICD-10-CM | POA: Diagnosis present

## 2016-01-01 DIAGNOSIS — F4310 Post-traumatic stress disorder, unspecified: Secondary | ICD-10-CM

## 2016-01-01 DIAGNOSIS — Z823 Family history of stroke: Secondary | ICD-10-CM

## 2016-01-01 DIAGNOSIS — Z915 Personal history of self-harm: Secondary | ICD-10-CM | POA: Diagnosis not present

## 2016-01-01 DIAGNOSIS — M419 Scoliosis, unspecified: Secondary | ICD-10-CM | POA: Diagnosis present

## 2016-01-01 DIAGNOSIS — A159 Respiratory tuberculosis unspecified: Secondary | ICD-10-CM

## 2016-01-01 DIAGNOSIS — F1020 Alcohol dependence, uncomplicated: Secondary | ICD-10-CM

## 2016-01-01 DIAGNOSIS — F1220 Cannabis dependence, uncomplicated: Secondary | ICD-10-CM

## 2016-01-01 LAB — COMPREHENSIVE METABOLIC PANEL
ALBUMIN: 4.3 g/dL (ref 3.5–5.0)
ALT: 13 U/L — ABNORMAL LOW (ref 17–63)
ANION GAP: 8 (ref 5–15)
AST: 24 U/L (ref 15–41)
Alkaline Phosphatase: 50 U/L (ref 38–126)
BUN: 12 mg/dL (ref 6–20)
CHLORIDE: 104 mmol/L (ref 101–111)
CO2: 28 mmol/L (ref 22–32)
Calcium: 9.2 mg/dL (ref 8.9–10.3)
Creatinine, Ser: 0.87 mg/dL (ref 0.61–1.24)
GFR calc Af Amer: >60 mL/min (ref >60)
GFR calc non Af Amer: >60 mL/min (ref >60)
GLUCOSE: 121 mg/dL — AB (ref 65–99)
Potassium: 3.8 mmol/L (ref 3.5–5.1)
SODIUM: 140 mmol/L (ref 135–145)
TOTAL PROTEIN: 7.7 g/dL (ref 6.5–8.1)
Total Bilirubin: 1 mg/dL (ref 0.3–1.2)

## 2016-01-01 LAB — CBC WITH DIFFERENTIAL/PLATELET
BASOS ABS: 0 10*3/uL (ref 0–0.1)
BASOS PCT: 0 %
EOS ABS: 0.1 10*3/uL (ref 0–0.7)
Eosinophils Relative: 3 %
HCT: 40.2 % (ref 40.0–52.0)
Hemoglobin: 13.9 g/dL (ref 13.0–18.0)
Lymphocytes Relative: 33 %
Lymphs Abs: 1.6 10*3/uL (ref 1.0–3.6)
MCH: 33.6 pg (ref 26.0–34.0)
MCHC: 34.6 g/dL (ref 32.0–36.0)
MCV: 97.2 fL (ref 80.0–100.0)
MONO ABS: 0.4 10*3/uL (ref 0.2–1.0)
MONOS PCT: 9 %
NEUTROS PCT: 55 %
Neutro Abs: 2.6 10*3/uL (ref 1.4–6.5)
PLATELETS: 228 10*3/uL (ref 150–440)
RBC: 4.14 MIL/uL — ABNORMAL LOW (ref 4.40–5.90)
RDW: 12.5 % (ref 11.5–14.5)
WBC: 4.8 10*3/uL (ref 3.8–10.6)

## 2016-01-01 LAB — URINALYSIS COMPLETE WITH MICROSCOPIC (ARMC ONLY)
BACTERIA UA: NONE SEEN
BILIRUBIN URINE: NEGATIVE
Glucose, UA: NEGATIVE mg/dL
HGB URINE DIPSTICK: NEGATIVE
Ketones, ur: NEGATIVE mg/dL
LEUKOCYTES UA: NEGATIVE
NITRITE: NEGATIVE
PH: 6 (ref 5.0–8.0)
Protein, ur: NEGATIVE mg/dL
RBC / HPF: NONE SEEN RBC/hpf (ref 0–5)
Specific Gravity, Urine: 1.01 (ref 1.005–1.030)
WBC, UA: NONE SEEN WBC/hpf (ref 0–5)

## 2016-01-01 LAB — URINE DRUG SCREEN, QUALITATIVE (ARMC ONLY)
Amphetamines, Ur Screen: NOT DETECTED
BARBITURATES, UR SCREEN: NOT DETECTED
BENZODIAZEPINE, UR SCRN: NOT DETECTED
Cannabinoid 50 Ng, Ur ~~LOC~~: POSITIVE — AB
Cocaine Metabolite,Ur ~~LOC~~: POSITIVE — AB
MDMA (Ecstasy)Ur Screen: NOT DETECTED
METHADONE SCREEN, URINE: NOT DETECTED
OPIATE, UR SCREEN: NOT DETECTED
Phencyclidine (PCP) Ur S: NOT DETECTED
Tricyclic, Ur Screen: NOT DETECTED

## 2016-01-01 LAB — ETHANOL: Alcohol, Ethyl (B): 21 mg/dL — ABNORMAL HIGH (ref <5)

## 2016-01-01 MED ORDER — TRAZODONE HCL 100 MG PO TABS
100.0000 mg | ORAL_TABLET | Freq: Every day | ORAL | Status: DC
  Administered 2016-01-02 – 2016-01-05 (×4): 100 mg via ORAL
  Filled 2016-01-01 (×4): qty 1

## 2016-01-01 MED ORDER — TRAZODONE HCL 100 MG PO TABS
100.0000 mg | ORAL_TABLET | Freq: Every day | ORAL | Status: DC
  Administered 2016-01-01: 100 mg via ORAL
  Filled 2016-01-01: qty 1

## 2016-01-01 MED ORDER — MAGNESIUM HYDROXIDE 400 MG/5ML PO SUSP: 30.0000 mL | Freq: Every day | ORAL | Status: DC | PRN

## 2016-01-01 MED ORDER — ACETAMINOPHEN 325 MG PO TABS: 650.0000 mg | ORAL_TABLET | Freq: Four times a day (QID) | ORAL | Status: DC | PRN

## 2016-01-01 MED ORDER — ALUM & MAG HYDROXIDE-SIMETH 200-200-20 MG/5ML PO SUSP: 30.0000 mL | ORAL | Status: DC | PRN

## 2016-01-01 MED ORDER — MIRTAZAPINE 15 MG PO TABS
45.0000 mg | ORAL_TABLET | Freq: Every day | ORAL | Status: DC
  Administered 2016-01-02 – 2016-01-05 (×4): 45 mg via ORAL
  Filled 2016-01-01 (×4): qty 3

## 2016-01-01 MED ORDER — MIRTAZAPINE 15 MG PO TABS
45.0000 mg | ORAL_TABLET | Freq: Every day | ORAL | Status: DC
  Administered 2016-01-01: 45 mg via ORAL
  Filled 2016-01-01: qty 3

## 2016-01-01 MED ORDER — TRAMADOL HCL 50 MG PO TABS
50.0000 mg | ORAL_TABLET | Freq: Four times a day (QID) | ORAL | Status: DC | PRN
  Administered 2016-01-01: 50 mg via ORAL
  Filled 2016-01-01: qty 1

## 2016-01-01 MED ORDER — TRAMADOL HCL 50 MG PO TABS
50.0000 mg | ORAL_TABLET | Freq: Four times a day (QID) | ORAL | Status: DC | PRN
  Administered 2016-01-02: 50 mg via ORAL
  Filled 2016-01-01: qty 1

## 2016-01-01 NOTE — ED Notes (Signed)
BEHAVIORAL HEALTH ROUNDING  Patient sleeping: No.  Patient alert and oriented: yes  Behavior appropriate: Yes. ; If no, describe:  Nutrition and fluids offered: Yes  Toileting and hygiene offered: Yes  Sitter present: not applicable, Q 15 min safety rounds and observation via security camera. Law enforcement present: Yes ODS  

## 2016-01-01 NOTE — ED Notes (Signed)
ENVIRONMENTAL ASSESSMENT  Potentially harmful objects out of patient reach: Yes.  Personal belongings secured: Yes.  Patient dressed in hospital provided attire only: Yes.  Plastic bags out of patient reach: Yes.  Patient care equipment (cords, cables, call bells, lines, and drains) shortened, removed, or accounted for: Yes.  Equipment and supplies removed from bottom of stretcher: Yes.  Potentially toxic materials out of patient reach: Yes.  Sharps container removed or out of patient reach: Yes.   BEHAVIORAL HEALTH ROUNDING  Patient sleeping: No.  Patient alert and oriented: yes  Behavior appropriate: Yes. ; If no, describe:  Nutrition and fluids offered: Yes  Toileting and hygiene offered: Yes  Sitter present: not applicable, Q 15 min safety rounds and observation via security camera. Law enforcement present: Yes ODS   ED BHU PLACEMENT JUSTIFICATION  Is the patient under IVC or is there intent for IVC: Yes.  Is the patient medically cleared: Yes.  Is there vacancy in the ED BHU: Yes.  Is the population mix appropriate for patient: Yes.  Is the patient awaiting placement in inpatient or outpatient setting: Yes. Inpatient.  Has the patient had a psychiatric consult: Yes.  Survey of unit performed for contraband, proper placement and condition of furniture, tampering with fixtures in bathroom, shower, and each patient room: Yes. ; Findings: All clear  APPEARANCE/BEHAVIOR  calm, cooperative and adequate rapport can be established  NEURO ASSESSMENT  Orientation: time, place and person  Hallucinations: No.None noted (Hallucinations)  Speech: Normal  Gait: normal  RESPIRATORY ASSESSMENT  WNL  CARDIOVASCULAR ASSESSMENT  WNL  GASTROINTESTINAL ASSESSMENT  WNL  EXTREMITIES  WNL  PLAN OF CARE  Provide calm/safe environment. Vital signs assessed twice daily. ED BHU Assessment once each 12-hour shift. Collaborate withTTS daily or as condition indicates. Assure the ED provider has  rounded once each shift. Provide and encourage hygiene. Provide redirection as needed. Assess for escalating behavior; address immediately and inform ED provider.  Assess family dynamic and appropriateness for visitation as needed: Yes. ; If necessary, describe findings:  Educate the patient/family about BHU procedures/visitation: Yes. ; If necessary, describe findings: Pt is calm and cooperative at this time. Pt understanding and accepting of unit procedures/rules. Pt contracts for safety with this RN at this time. Will continue to monitor with Q 15 min safety rounds and observation via security camera.

## 2016-01-01 NOTE — ED Triage Notes (Signed)
Pt states he was having suicidal thoughts pta

## 2016-01-01 NOTE — ED Notes (Signed)
BEHAVIORAL HEALTH ROUNDING Patient sleeping: No. Patient alert and oriented: yes Behavior appropriate: Yes.  ; If no, describe:  Nutrition and fluids offered: yes Toileting and hygiene offered: Yes  Sitter present: q15 minute observations and security  monitoring Law enforcement present: Yes  ODS  

## 2016-01-01 NOTE — Progress Notes (Signed)
Skin assessment and Contrabands check completed: gross skin intact except from of "quarter size oblong" pink area on the outside of right knee, left knee surgical healed and posterior left upper shoulder scars noted. No contrabands found on patient or in his belongings.
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
## 2016-01-01 NOTE — BH Assessment (Signed)
[facility] Toni Amendlapacs.  Attending Physician will be Dr. Ardyth HarpsHernandez.   Patient [facility] Victorino DikeJennifer.   Intake Paper Work has been signed and placed on patient chart.  ER staff is aware of the admission (Dr. Derrill KayGoodman, ER MD & Lowanda FosterBrittany, Patient Access).
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
## 2016-01-01 NOTE — Tx Team (Signed)
Initial Treatment Plan 01/01/2016 11:25 PM Devin Maladyichard Griffin OZH:[id number]    PATIENT STRESSORS: Financial difficulties Medication change or noncompliance Substance abuse   PATIENT STRENGTHS: Capable of independent living General fund of knowledge   PATIENT IDENTIFIED PROBLEMS: Depression  Suicidal Ideation  Substance abuse    "I don't know"             DISCHARGE CRITERIA:  Improved stabilization in mood, thinking, and/or behavior Motivation to continue treatment in a less acute level of care Need for constant or close observation no longer present Reduction of life-threatening or endangering symptoms to within safe limits Verbal commitment to aftercare and medication compliance  PRELIMINARY DISCHARGE PLAN: Attend aftercare/continuing care group Outpatient therapy  PATIENT/FAMILY INVOLVEMENT: This treatment plan has been presented to and reviewed with the patient, Devin MaladyRichard Brandt, and/or family member.  The patient and family have been given the opportunity to ask questions and make suggestions.  Beckie BusingMichelle L Parsells, RN 01/01/2016, 11:25 PM

## 2016-01-01 NOTE — Progress Notes (Signed)
Patient ID: Devin MaladyRichard Brandt, male   DOB: 02/09/1961, 55 y.o.   MRN: [id number]  [facility] Pt is alert and oriented x4. Pt states his reason for admission is "depression again." Pt rates depression and anxiety 7/10. He reports SI without a plan and contracts for safety. Denies HI/AVH. Pt affect is depressed and irritable. He is guarded with interaction and avoids eye contact. Pt speech is slurred and difficult to understand. Pt oriented to unit. No questions or concerns at this time. q15 minute safety checks maintained. Pt remains free from harm. Will continue to monitor.

## 2016-01-01 NOTE — ED Notes (Signed)
Report called to [place], RN in [facility].
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
## 2016-01-01 NOTE — ED Notes (Signed)
[facility] Karina   Pt to transfer at this time  IVC

## 2016-01-02 DIAGNOSIS — F1020 Alcohol dependence, uncomplicated: Secondary | ICD-10-CM

## 2016-01-02 DIAGNOSIS — F331 Major depressive disorder, recurrent, moderate: Principal | ICD-10-CM

## 2016-01-02 DIAGNOSIS — F1220 Cannabis dependence, uncomplicated: Secondary | ICD-10-CM

## 2016-01-02 DIAGNOSIS — F4310 Post-traumatic stress disorder, unspecified: Secondary | ICD-10-CM

## 2016-01-02 HISTORY — DX: Cannabis dependence, uncomplicated: F12.20

## 2016-01-02 HISTORY — DX: Alcohol dependence, uncomplicated: F10.20

## 2016-01-02 LAB — HEMOGLOBIN A1C: HEMOGLOBIN A1C: 4.8 % (ref 4.0–6.0)

## 2016-01-02 MED ORDER — ACETAMINOPHEN 500 MG PO TABS: 1000.0000 mg | ORAL_TABLET | Freq: Four times a day (QID) | ORAL | Status: DC | PRN

## 2016-01-02 MED ORDER — NICOTINE 21 MG/24HR TD PT24
21.0000 mg | MEDICATED_PATCH | Freq: Every day | TRANSDERMAL | Status: DC
  Filled 2016-01-02 (×2): qty 1

## 2016-01-02 MED ORDER — IBUPROFEN 600 MG PO TABS
600.0000 mg | ORAL_TABLET | ORAL | Status: DC | PRN
  Administered 2016-01-02 – 2016-01-03 (×3): 600 mg via ORAL
  Filled 2016-01-02 (×3): qty 1

## 2016-01-02 NOTE — [facility] Group Notes (Signed)
Goals Group  Date/Time: 9:00AM Type of Therapy and Topic: Group Therapy: Goals Group: SMART Goals  ?  Participation Level: Moderate  ?  Description of Group:  ?  The purpose of a daily goals group is to assist and guide patients in setting recovery/wellness-related goals. The objective is to set goals as they relate to the crisis in which they were admitted. Patients will be using SMART goal modalities to set measurable goals. Characteristics of realistic goals will be discussed and patients will be assisted in setting and processing how one will reach their goal. Facilitator will also assist patients in applying interventions and coping skills learned in psycho-education groups to the SMART goal and process how one will achieve defined goal.  ?  Therapeutic Goals:  ?  -Patients will develop and document one goal related to or their crisis in which brought them into treatment.  -Patients will be guided by LCSW using SMART goal setting modality in how to set a measurable, attainable, realistic and time sensitive goal.  -Patients will process barriers in reaching goal.  -Patients will process interventions in how to overcome and successful in reaching goal.  ?  Patient's Goal: Patient invited but did not attend. ?  Therapeutic Modalities:  Motivational Interviewing  Cognitive Behavioral Therapy  Crisis Intervention Model  SMART goals setting   Kylie Gros, MSW, LCSW-A 

## 2016-01-02 NOTE — [facility] Group Notes (Signed)
[facility] LCSW Group Therapy   01/02/2016 9:30 am   Type of Therapy: Group Therapy   Participation Level: Invited but did not attend.  Participation Quality: Invited but did not attend.    Devin Brandt, MSW, LCSW-A 01/02/2016, 11:42AM

## 2016-01-02 NOTE — Progress Notes (Signed)
D: Pt presents with flat affect. Reports depressed and SI at times. Pt currently denies SI, but endorses Auditory hallucinations. Pt would not inform Clinical research associatewriter of what voices were saying. Pt verbally contracts for safety. Pt complaints of pain to shoulder radiating to neck. A: Encouragement and support offered. Medications administered as prescribed. Prn for pain given with partial relief. Encouraged patient to go to group and participate. Safety checks maintained on unit. R: Pt did not attend group. Was able to verbalize feelings to nurse. Medication compliant. Eating meals well. Reports improvement in pain. Remains safe on unit withq 15 min checks.
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
## 2016-01-02 NOTE — Plan of Care (Signed)
Problem: Safety: Goal: Ability to remain free from injury will improve Outcome: Progressing No self injury reported or observed   
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
## 2016-01-02 NOTE — [facility] Suicide Risk Assessment (Signed)
[facility]   Nursing information obtained from:  Patient, Review of record Demographic factors:  Male, Living alone Current Mental Status:  Suicidal ideation indicated by patient, Suicide plan, Self-harm thoughts Loss Factors:  Financial problems / change in socioeconomic status, Loss of significant relationship, Decrease in vocational status Historical Factors:  NA Risk Reduction Factors:  NA  Total Time spent with patient: 1 hour Principal Problem: Depression, major, recurrent, moderate (HCC) Diagnosis:   Patient Active Problem List   Diagnosis Date Noted  . Alcohol use disorder, severe, dependence (HCC) [F10.20] 01/02/2016  . Cannabis use disorder, moderate, dependence (HCC) [F12.20] 01/02/2016  . Tobacco use disorder [F17.200] 01/02/2016  . PTSD (post-traumatic stress disorder) [F43.10] 01/02/2016  . Depression, major, recurrent, moderate (HCC) [F33.1] 12/25/2015  . Cocaine use disorder, moderate, dependence (HCC) [F14.20] 02/09/2015   Subjective Data:   Continued Clinical Symptoms:  Alcohol Use Disorder Identification Test Final Score (AUDIT): 10 The "Alcohol Use Disorders Identification Test", Guidelines for Use in Primary Care, Second Edition.  World Science [employer] [facility](WHO). Score between 0-7:  no or low risk or alcohol related problems. Score between 8-15:  moderate risk of alcohol related problems. Score between 16-19:  high risk of alcohol related problems. Score 20 or above:  warrants further diagnostic evaluation for alcohol dependence and treatment.   CLINICAL FACTORS:   Severe Anxiety and/or Agitation Depression:   Comorbid alcohol abuse/dependence Impulsivity Alcohol/Substance Abuse/Dependencies More than one psychiatric diagnosis Previous Psychiatric Diagnoses and Treatments    Psychiatric Specialty Exam: Physical Exam  ROS  Blood pressure 137/86, pulse 60, temperature 98.1 F (36.7 C), temperature source Oral, resp. rate 18,  height 5\' 9"  (1.753 m), weight 67.6 kg (149 lb), SpO2 100 %.Body mass index is 22 kg/m.                                                    Sleep:  Number of Hours: 7      COGNITIVE FEATURES THAT CONTRIBUTE TO RISK:  None    SUICIDE RISK:   Moderate:  Frequent suicidal ideation with limited intensity, and duration, some specificity in terms of plans, no associated intent, good self-control, limited dysphoria/symptomatology, some risk factors present, and identifiable protective factors, including available and accessible social support.   [facility]  I certify that inpatient services furnished can reasonably be expected to improve the patient's condition.  Jimmy FootmanHernandez-Gonzalez,  Tangee Marszalek, MD 01/02/2016, 9:38 AM

## 2016-01-02 NOTE — Progress Notes (Signed)
NUTRITION ASSESSMENT  Pt identified as at risk on the Malnutrition Screen Tool  INTERVENTION: 1. Monitor intake and cater to pt preferences within diet restrictions 2. Supplement: If unable to meet nutritional needs recommend adding Ensure Enlive po BID, each supplement provides 350 kcal and 20 grams of protein   NUTRITION DIAGNOSIS: Unintentional weight loss related to sub-optimal intake as evidenced by pt report.   Goal: Pt to meet >/= 90% of their estimated nutrition needs.  Monitor:  PO intake  Assessment:    55 y.o. male admitted with substance abuse and depression  Height: Ht Readings from Last 1 Encounters:  01/01/16 5\' 9"  (1.753 m)    Weight: Wt Readings from Last 1 Encounters:  01/01/16 149 lb (67.6 kg)    Weight Hx:  reviewed Wt Readings from Last 10 Encounters:  01/01/16 149 lb (67.6 kg)  12/25/15 150 lb (68 kg)  08/15/15 156 lb 3.2 oz (70.9 kg)  06/16/15 160 lb (72.6 kg)  02/19/15 150 lb (68 kg)  02/08/15 150 lb (68 kg)  11/07/14 165 lb (74.8 kg)  10/23/14 167 lb (75.8 kg)  08/30/14 165 lb (74.8 kg)    BMI:  Body mass index is 22 kg/m.   Estimated Nutritional Needs: Kcal: 1700-2040 kcal/d Protein: > 68 g/d Fluid: 1700-203900ml/d  Diet Order: Diet regular Room service appropriate? Yes; Fluid consistency: Thin Pt is also offered choice of unit snacks.  Pt is eating as desired. Noted pt eating 90% per I and O sheet  Lab results and medications reviewed.   Namish Krise B. Freida BusmanAllen, RD, LDN [phone] (pager) Weekend/On-Call pager [phone])

## 2016-01-02 NOTE — [facility] Suicide Risk Assessment (Signed)
[facility] INPATIENT:  Family/Significant Other Suicide Prevention Education  Suicide Prevention Education:  Patient Refusal for Family/Significant Other Suicide Prevention Education: The patient Devin MaladyRichard Main has refused to provide written consent for family/significant other to be provided Family/Significant Other Suicide Prevention Education during admission and/or prior to discharge.  Physician notified. CSW completed SPE with the pt.   Dorothe PeaJonathan F Tala Eber 01/02/2016, 2:53 PM

## 2016-01-03 ENCOUNTER — Inpatient Hospital Stay: Payer: Medicaid Other

## 2016-01-03 MED ORDER — IBUPROFEN 600 MG PO TABS
600.0000 mg | ORAL_TABLET | Freq: Three times a day (TID) | ORAL | Status: AC
  Administered 2016-01-03 – 2016-01-04 (×3): 600 mg via ORAL
  Filled 2016-01-03 (×3): qty 1

## 2016-01-03 MED ORDER — CYCLOBENZAPRINE HCL 10 MG PO TABS
5.0000 mg | ORAL_TABLET | Freq: Three times a day (TID) | ORAL | Status: DC
  Administered 2016-01-03 – 2016-01-06 (×9): 5 mg via ORAL
  Filled 2016-01-03 (×9): qty 1

## 2016-01-03 NOTE — [facility] Group Notes (Signed)
[facility] Group Notes:  (Nursing/MHT/Case Management/Adjunct)  Date:  01/03/2016  Time:  5:07 PM  Type of Therapy:  Psychoeducational Skills  Participation Level:  Minimal  Participation Quality:  Appropriate, Attentive and Supportive  Affect:  Appropriate  Cognitive:  Appropriate  Insight:  Appropriate  Engagement in Group:  Engaged and Supportive  Modes of Intervention:  Discussion and Education  Summary of Progress/Problems:  Devin Brandt 01/03/2016, 5:07 PM

## 2016-01-03 NOTE — Tx Team (Signed)
Interdisciplinary Treatment and Diagnostic Plan Update  01/03/2016 Time of Session: 10:49 AM  Devin Brandt MRN: [id number]  Principal Diagnosis: Depression, major, recurrent, moderate (HCC)  Secondary Diagnoses: Principal Problem:   Depression, major, recurrent, moderate (HCC) Active Problems:   Cocaine use disorder, moderate, dependence (HCC)   Alcohol use disorder, severe, dependence (HCC)   Cannabis use disorder, moderate, dependence (HCC)   Tobacco use disorder   PTSD (post-traumatic stress disorder)   Current Medications:  Current Facility-Administered Medications  Medication Dose Route Frequency Provider Last Rate Last Dose  . acetaminophen (TYLENOL) tablet 1,000 mg  1,000 mg Oral Q6H PRN Jimmy Footman, MD      . alum & mag hydroxide-simeth (MAALOX/MYLANTA) 200-200-20 MG/5ML suspension 30 mL  30 mL Oral Q4H PRN [name], MD      . cyclobenzaprine (FLEXERIL) tablet 5 mg  5 mg Oral TID Jimmy Footman, MD      . ibuprofen (ADVIL,MOTRIN) tablet 600 mg  600 mg Oral TID Jimmy Footman, MD      . magnesium hydroxide (MILK OF MAGNESIA) suspension 30 mL  30 mL Oral Daily PRN [name], MD      . mirtazapine (REMERON) tablet 45 mg  45 mg Oral QHS [name], MD   45 mg at 01/02/16 2142  . nicotine (NICODERM CQ - dosed in mg/24 hours) patch 21 mg  21 mg Transdermal Daily Jimmy Footman, MD      . traZODone (DESYREL) tablet 100 mg  100 mg Oral QHS [name], MD   100 mg at 01/02/16 2142    PTA Medications: Prescriptions Prior to Admission  Medication Sig Dispense Refill Last Dose  . ibuprofen (ADVIL,MOTRIN) 800 MG tablet Take 1 tablet (800 mg total) by mouth every 8 (eight) hours as needed. 20 tablet 0 Past Week at Unknown time  . traZODone (DESYREL) 100 MG tablet Take 1 tablet (100 mg total) by mouth at bedtime. 30 tablet 0 Past Week at Unknown time  . mirtazapine (REMERON) 45 MG tablet Take 1 tablet (45 mg total) by  mouth at bedtime. (Patient not taking: Reported on 01/01/2016) 30 tablet 0 Not Taking  . sulfamethoxazole-trimethoprim (BACTRIM DS,SEPTRA DS) 800-160 MG tablet Take 1 tablet by mouth 2 (two) times daily.   Taking  . traMADol (ULTRAM) 50 MG tablet Take 1 tablet (50 mg total) by mouth every 6 (six) hours as needed. 30 tablet 0     Treatment Modalities: Medication Management, Group therapy, Case management,  1 to 1 session with clinician, Psychoeducation, Recreational therapy.   Physician Treatment Plan for Primary Diagnosis: Depression, major, recurrent, moderate (HCC) Long Term Goal(s): Improvement in symptoms so as ready for discharge  Short Term Goals: Ability to verbalize feelings will improve, Ability to disclose and discuss suicidal ideas, Ability to identify and develop effective coping behaviors will improve, Compliance with prescribed medications will improve and Ability to identify triggers associated with substance abuse/mental health issues will improve  Medication Management: Evaluate patient's response, side effects, and tolerance of medication regimen.  Therapeutic Interventions: 1 to 1 sessions, Unit Group sessions and Medication administration.  Evaluation of Outcomes: Progressing  Physician Treatment Plan for Secondary Diagnosis: Principal Problem:   Depression, major, recurrent, moderate (HCC) Active Problems:   Cocaine use disorder, moderate, dependence (HCC)   Alcohol use disorder, severe, dependence (HCC)   Cannabis use disorder, moderate, dependence (HCC)   Tobacco use disorder   PTSD (post-traumatic stress disorder)   Long Term Goal(s): Improvement in symptoms  so as ready for discharge  Short Term Goals: Ability to identify changes in lifestyle to reduce recurrence of condition will improve, Ability to verbalize feelings will improve, Ability to demonstrate self-control will improve and Ability to identify triggers associated with substance abuse/mental health  issues will improve  Medication Management: Evaluate patient's response, side effects, and tolerance of medication regimen.  Therapeutic Interventions: 1 to 1 sessions, Unit Group sessions and Medication administration.  Evaluation of Outcomes: Progressing   RN Treatment Plan for Primary Diagnosis: Depression, major, recurrent, moderate (HCC) Long Term Goal(s): Knowledge of disease and therapeutic regimen to maintain health will improve  Short Term Goals: Ability to identify and develop effective coping behaviors will improve  Medication Management: RN will administer medications as ordered by provider, will assess and evaluate patient's response and provide education to patient for prescribed medication. RN will report any adverse and/or side effects to prescribing provider.  Therapeutic Interventions: 1 on 1 counseling sessions, Psychoeducation, Medication administration, Evaluate responses to treatment, Monitor vital signs and CBGs as ordered, Perform/monitor CIWA, COWS, AIMS and Fall Risk screenings as ordered, Perform wound care treatments as ordered.  Evaluation of Outcomes: Progressing   LCSW Treatment Plan for Primary Diagnosis: Depression, major, recurrent, moderate (HCC) Long Term Goal(s): Safe transition to appropriate next level of care at discharge, Engage patient in therapeutic group addressing interpersonal concerns.  Short Term Goals: Engage patient in aftercare planning with referrals and resources, Increase social support, Increase ability to appropriately verbalize feelings, Increase emotional regulation, Facilitate patient progression through stages of change regarding substance use diagnoses and concerns, Identify triggers associated with mental health/substance abuse issues and Increase skills for wellness and recovery  Therapeutic Interventions: Assess for all discharge needs, 1 to 1 time with Social worker, Explore available resources and support systems, Assess for  adequacy in community support network, Educate family and significant other(s) on suicide prevention, Complete Psychosocial Assessment, Interpersonal group therapy.  Evaluation of Outcomes: Progressing   Progress in Treatment: Attending groups: Yes Participating in groups: Yes Taking medication as prescribed: Yes, MD continues to assess for medication changes as needed Toleration medication: Yes, no side effects reported at this time Family/Significant other contact made: CSW, still assessing for appropriate contacts Patient understands diagnosis: Yes Discussing patient identified problems/goals with staff: Yes Medical problems stabilized or resolved:  Yes   Denies suicidal/homicidal ideation: Yes Issues/concerns per patient self-inventory: None Other: N/A  New problem(s) identified: None identified at this time.   New Short Term/Long Term Goal(s): None identified at this time.   Discharge Plan or Barriers: [place] [facility] and [facility] with [facility] for medication management and therapy   Reason for Continuation of Hospitalization: Anxiety Depression Suicidal ideation Withdrawal symptoms   Estimated Length of Stay: 3-5 days   Attendees: Patient: Devin Brandt 01/03/2016  10:40 AM  Physician: Dr. Ardyth HarpsHernandez, MD 01/03/2016  10:40 AM  Nursing: Shelia MediaJanet Jones, RN 01/03/2016  10:40 AM  Social Worker: Dorothe PeaJonathan F. Durward ParcelRiffey, LCSWA, LCAS 01/03/2016  10:40 AM  Social Worker: Hampton AbbotKadijah Grant LCSWA 01/03/2016  10:40 AM  Recreational Therapist: Hershal CoriaBeth Greene, LRT 01/03/2016  10:40 AM  P.A. Student: Alveria ApleySophia Caccavale 01/03/2016  10:40 AM   01/03/2016  10:40 AM  Other: 01/03/2016  10:40 AM    Scribe for Treatment Team:  Dorothe PeaJonathan F. Athziry Millican MSW, LCSWA, LCAS

## 2016-01-03 NOTE — [facility] Group Notes (Signed)
[facility] LCSW Group Therapy   01/03/2016 1:00 PM   Type of Therapy: Group Therapy   Participation Level: Active   Participation Quality: Attentive, Sharing and Supportive   Affect: Appropriate   Cognitive: Alert and Oriented   Insight: Developing/Improving and Engaged   Engagement in Therapy: Developing/Improving and Engaged   Modes of Intervention: Clarification, Confrontation, Discussion, Education, Exploration, Limit-setting, Orientation, Problem-solving, Rapport Building, Dance movement psychotherapisteality Testing, Socialization and Support   Summary of Progress/Problems: The topic for today was feelings about relapse. Pt discussed what relapse prevention is to them and identified triggers that they are on the path to relapse. Pt processed their feeling towards relapse and was able to relate to peers. Pt discussed coping skills that can be used for relapse prevention. Patient identifies a sense of confusion and boredom as triggers to relapse. Pt stated that rehab programs, AA/NA meetings, and surrounding himself with positive people will be helpful in his recovery process.   Devin Brandt, MSW, Theresia MajorsLCSWA
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
## 2016-01-03 NOTE — Progress Notes (Signed)
Recreation Therapy Notes  Date: 09.08.17 Time: 9:30 am Location: Craft Room  Group Topic: Coping Skills  Goal Area(s) Addresses:  Patient will participate in healthy coping skill. Patient will verbalize benefit of using art as a coping skill.  Behavioral Response: Did not attend   Intervention: Coloring  Activity: Patients were given coloring sheets to color.  Education: LRT educated patients on healthy coping skills.  Education Outcome: Patient did not attend group.  Clinical Observations/Feedback: Patient did not attend group.  Zhamir Pirro M, LRT/CTRS 01/03/2016 10:24 AM 

## 2016-01-03 NOTE — Progress Notes (Signed)
D: Pt pleasant and cooperative with care. He continues to c/o pain in his left shoulder radiating to his neck. .Denies SI/HI/AVH at this time. A: Emotional support and encouragement provided. Medications administered with education. q15 minute safety checks maintained. R: Pt remains free from harm. Will continue to monitor.

## 2016-01-03 NOTE — Plan of Care (Signed)
Problem: Safety: Goal: Periods of time without injury will increase Outcome: Progressing Pt remains free from harm.  Problem: Coping: Goal: Ability to cope will improve Outcome: Progressing Pt reports decreased depression this evening. Reports that he "stays mellow" by laying in bed and relaxing.

## 2016-01-03 NOTE — Progress Notes (Signed)
D: Pt affect is less irritable this evening. He continues to c/o pain in his left shoulder radiating to his neck. He rates anxiety 7/10 and depression 0/10 at this time, "as long as I stay mellow." Denies SI/HI/AVH at this time. A: Emotional support and encouragement provided. Medications administered with education. q15 minute safety checks maintained. R: Pt remains free from harm. Will continue to monitor.

## 2016-01-03 NOTE — Plan of Care (Signed)
Problem: Education: Goal: Mental status will improve Outcome: Progressing Patient denies SI, HI, AVH

## 2016-01-03 NOTE — [facility] Group Notes (Signed)
[facility] Group Notes:  (Nursing/MHT/Case Management/Adjunct)  Date:  01/03/2016  Time:  2:00 AM  Type of Therapy:  Group Therapy  Participation Level:  Minimal  Participation Quality:  Resistant  Affect:  Flat and Irritable  Cognitive:  Lacking  Insight:  Lacking  Engagement in Group:  Defensive and Lacking  Modes of Intervention:  Discussion  Summary of Progress/Problems: Pt stated that he didn't have a goal and didn't plan on making one any of the time he was here. Staff attempted to get pt to elaborate. Pt stopped responding to staff.   Fanny Skatesshley Imani Letha Mirabal 01/03/2016, 2:00 AM
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
## 2016-01-04 NOTE — Progress Notes (Signed)
D:Pt pleasant and cooperative with care. Attended evening group.  C/O pain in his left shoulder radiating to his neck. Denies SI/HI/AVH at this time. A:Emotional support and encouragement provided. Medications administered with education. q15 minute safety checks maintained. R:Pt remains free from harm. Will continue to monitor

## 2016-01-04 NOTE — [facility] Group Notes (Signed)
[facility] Group Notes:  (Nursing/MHT/Case Management/Adjunct)  Date:  01/04/2016  Time:  1:41 AM  Type of Therapy:  Psychoeducational Skills  Participation Level:  Minimal  Participation Quality:  Appropriate  Affect:  Appropriate  Cognitive:  Alert  Insight:  Good  Engagement in Group:  Supportive  Modes of Intervention:  Activity  Summary of Progress/Problems:  [name] 01/04/2016, 1:41 AM

## 2016-01-04 NOTE — Progress Notes (Signed)
Patient awake, alert, and oriented. Mood is euthymic with congruent affect. Patient denies SI. No evidence of psychosis. Patient denies any s/s withdrawal.  Patient was superficial on assessment. He was cooperative with nursing interventions although declined any teaching regarding disease process and treatment.  Will continue treatment plan and monitor response. Encourage participation in milieu and groups. Maintain on all safety precautions.

## 2016-01-04 NOTE — [facility] Group Notes (Signed)
[facility] LCSW Group Therapy  01/04/2016 2:15 PM  Type of Therapy:  Group Therapy  Participation Level:  Active  Participation Quality:  Appropriate and Sharing  Affect:  Appropriate  Cognitive:  Alert  Insight:  Improving  Engagement in Therapy:  Engaged  Modes of Intervention:  Activity, Discussion and Support  Summary of Progress/Problems:Self esteem: Patients discussed self esteem and how it impacts them. They discussed what aspects in their lives has influenced their self esteem. They were challenged to identify changes that are needed in order to improve self esteem. Patients participated in activity where they had to identify positive adjectives they felt described their personality. Patients shared with the group on the following areas: Things I am good at, What I like about my appearance, I've helped others by, What I value the most, compliments I have received, challenges I have overcome, thing that make me unique, and Times I've made others happy. Pt stated that he is grateful for the treatment he is receiving her and is grateful that he is starting to feel better.    Devin Brandt MSW, LCSWA 01/04/2016, 2:17 PM
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
## 2016-01-05 NOTE — [facility] Group Notes (Signed)
[facility] Group Notes:  (Nursing/MHT/Case Management/Adjunct)  Date:  01/05/2016  Time:  5:28 AM  Type of Therapy:  Psychoeducational Skills  Participation Level:  Active  Participation Quality:  Appropriate  Affect:  Appropriate  Cognitive:  Appropriate  Insight:  Appropriate  Engagement in Group:  Engaged  Modes of Intervention:  Discussion, Socialization and Support  Summary of Progress/Problems:  Chancy MilroyLaquanda Y Mirl Hillery 01/05/2016, 5:28 AM

## 2016-01-05 NOTE — Plan of Care (Signed)
Problem: Coping: Goal: Ability to verbalize feelings will improve Outcome: Progressing Denies SI/HI/AVH.

## 2016-01-05 NOTE — Progress Notes (Signed)
Patient pleasant, denied SI/HI/AVH and contracted for safety. He attended groups and minimally interacted with peers and staff. Patient stated that his goal for the day was to stay open-minded. Patient stated that he plans to "listen more." He refused his Nicotine patch stated that he did not need it but stated that Flexeril is helps him with pain. Medication education done. Patient encouraged to report thoughts feelings and concerns to nursing staff. He verbalized understanding.

## 2016-01-05 NOTE — [facility] Group Notes (Signed)
[facility] LCSW Group Therapy Note  01/05/2016 1:00pm  Type of Therapy and Topic: Group Therapy: Holding onto Grudges   Pt did not attend, declined invitation.    Mystique Bjelland Carter, LCSWA 01/05/2016 1:04 PM 

## 2016-01-05 NOTE — Progress Notes (Signed)
Patient is alert and oriented x4; stated, "I feel depressed; the same; I don't feel like talking; accepted PM meds without complaint. Continue to monitor status; remains on q 15 min rounding.
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
## 2016-01-06 MED ORDER — TRAZODONE HCL 100 MG PO TABS: 100.0000 mg | ORAL_TABLET | Freq: Every day | ORAL | 0 refills | Status: DC

## 2016-01-06 MED ORDER — MIRTAZAPINE 45 MG PO TABS: 45.0000 mg | ORAL_TABLET | Freq: Every day | ORAL | 0 refills | Status: DC

## 2016-01-06 MED ORDER — CYCLOBENZAPRINE HCL 5 MG PO TABS: 5.0000 mg | ORAL_TABLET | Freq: Three times a day (TID) | ORAL | 0 refills | Status: DC | PRN

## 2016-01-06 NOTE — [facility] Suicide Risk Assessment (Signed)
[facility]   Principal Problem: Depression, major, recurrent, moderate (HCC) Discharge Diagnoses:  Patient Active Problem List   Diagnosis Date Noted  . Alcohol use disorder, severe, dependence (HCC) [F10.20] 01/02/2016  . Cannabis use disorder, moderate, dependence (HCC) [F12.20] 01/02/2016  . Tobacco use disorder [F17.200] 01/02/2016  . PTSD (post-traumatic stress disorder) [F43.10] 01/02/2016  . Depression, major, recurrent, moderate (HCC) [F33.1] 12/25/2015  . Cocaine use disorder, moderate, dependence (HCC) [F14.20] 02/09/2015      Psychiatric Specialty Exam: ROS  Blood pressure 133/83, pulse (!) 56, temperature 98.7 F (37.1 C), temperature source Oral, resp. rate 18, height 5\' 9"  (1.753 m), weight 67.6 kg (149 lb), SpO2 99 %.Body mass index is 22 kg/m.                                                       Mental Status Per Nursing Assessment::   On Admission:  Suicidal ideation indicated by patient, Suicide plan, Self-harm thoughts  Demographic Factors:  Male, Low socioeconomic status and Living alone  Loss Factors: Financial problems/change in socioeconomic status  Historical Factors: Impulsivity  Risk Reduction Factors:   Positive therapeutic relationship  Continued Clinical Symptoms:  Alcohol/Substance Abuse/Dependencies More than one psychiatric diagnosis Previous Psychiatric Diagnoses and Treatments  Cognitive Features That Contribute To Risk:  None    Suicide Risk:  Minimal: No identifiable suicidal ideation.  Patients presenting with no risk factors but with morbid ruminations; may be classified as minimal risk based on the severity of the depressive symptoms  Follow-up Information    [facility] [facility] .   Why:  Your Peer Support specialist [name] will visit you at your home the day after discharge between the hours of 12noon-5pm for therapy.  Please call [name] at  ph: [phone] for questions or assistance. Contact information: [facility] [place], [place] [facility] [address] [place], [place] [address] Phone: [phone] Fax: [phone]         [facility] .   Why:      Please arrive to the walk-in clinic at discharge between the hours of 8am-4pm for your hospital follow up and for medication management, substance abuse treatment and therapy.  Arrive as early as possible for prompt service.  Contact information: [facility] [address] [place], [place] [address] Phone: [phone]) [phone] Fax: [phone]            Jimmy FootmanHernandez-Gonzalez,  Elspeth Blucher, MD 01/06/2016, 9:08 AM

## 2016-01-06 NOTE — Progress Notes (Signed)
Patient discharged home. DC instructions provided and explained. Medications reviewed. Rx given. All questions answered. Pt denies SI, Hi, AVH. Pt stable at discharge.

## 2016-01-06 NOTE — [facility] Group Notes (Signed)
[facility] LCSW Group Therapy   01/06/2016 9:30am Type of Therapy: Group Therapy   Participation Level: Active   Participation Quality: Attentive, Sharing and Supportive   Affect: Appropriate  Cognitive: Alert and Oriented   Insight: Developing/Improving and Engaged   Engagement in Therapy: Developing/Improving and Engaged  Modes of Intervention: Clarification, Confrontation, Discussion, Education, Exploration,  Limit-setting, Orientation, Problem-solving, Rapport Building, Dance movement psychotherapisteality Testing, Socialization and Support   Summary of Progress/Problems: Pt identified obstacles faced currently and processed barriers involved in overcoming these obstacles. Pt identified steps necessary for overcoming these obstacles and explored motivation (internal and external) for facing these difficulties head on. Pt further identified one area of concern in their lives and chose a goal to focus on for today. Pt defined an obstacle as "stumbling blocks." He stated that substance abuse is his biggest obstacle at this time. Pt stated that he has to find a better support system in the community. He identified his sponsor from [employer] as his support system at this time. Pt believes that if he believes in himself that he will overcome many obstacles that he faces.  Hampton AbbotKadijah Khamryn Calderone, MSW, Theresia MajorsLCSWA

## 2016-01-06 NOTE — Tx Team (Signed)
Interdisciplinary Treatment and Diagnostic Plan Update  01/06/2016 Time of Session: 2:25 PM  Devin Brandt MRN: [id number]  Principal Diagnosis: Depression, major, recurrent, moderate (HCC)  Secondary Diagnoses: Principal Problem:   Depression, major, recurrent, moderate (HCC) Active Problems:   Cocaine use disorder, moderate, dependence (HCC)   Alcohol use disorder, severe, dependence (HCC)   Cannabis use disorder, moderate, dependence (HCC)   Tobacco use disorder   PTSD (post-traumatic stress disorder)   Current Medications:  Current Facility-Administered Medications  Medication Dose Route Frequency Provider Last Rate Last Dose  . acetaminophen (TYLENOL) tablet 1,000 mg  1,000 mg Oral Q6H PRN Jimmy Footman, MD      . alum & mag hydroxide-simeth (MAALOX/MYLANTA) 200-200-20 MG/5ML suspension 30 mL  30 mL Oral Q4H PRN [name], MD      . cyclobenzaprine (FLEXERIL) tablet 5 mg  5 mg Oral TID Jimmy Footman, MD   5 mg at 01/06/16 9562  . magnesium hydroxide (MILK OF MAGNESIA) suspension 30 mL  30 mL Oral Daily PRN [name], MD      . mirtazapine (REMERON) tablet 45 mg  45 mg Oral QHS [name], MD   45 mg at 01/05/16 2116  . nicotine (NICODERM CQ - dosed in mg/24 hours) patch 21 mg  21 mg Transdermal Daily Jimmy Footman, MD      . traZODone (DESYREL) tablet 100 mg  100 mg Oral QHS [name], MD   100 mg at 01/05/16 2116   Current Outpatient Prescriptions  Medication Sig Dispense Refill  . cyclobenzaprine (FLEXERIL) 5 MG tablet Take 1 tablet (5 mg total) by mouth 3 (three) times daily as needed for muscle spasms. 15 tablet 0  . mirtazapine (REMERON) 45 MG tablet Take 1 tablet (45 mg total) by mouth at bedtime. 30 tablet 0  . traZODone (DESYREL) 100 MG tablet Take 1 tablet (100 mg total) by mouth at bedtime. 30 tablet 0    PTA Medications: No prescriptions prior to admission.    Treatment Modalities: Medication Management,  Group therapy, Case management,  1 to 1 session with clinician, Psychoeducation, Recreational therapy.   Physician Treatment Plan for Primary Diagnosis: Depression, major, recurrent, moderate (HCC) Long Term Goal(s): Improvement in symptoms so as ready for discharge  Short Term Goals: Ability to verbalize feelings will improve, Ability to disclose and discuss suicidal ideas, Ability to identify and develop effective coping behaviors will improve, Compliance with prescribed medications will improve and Ability to identify triggers associated with substance abuse/mental health issues will improve  Medication Management: Evaluate patient's response, side effects, and tolerance of medication regimen.  Therapeutic Interventions: 1 to 1 sessions, Unit Group sessions and Medication administration.  Evaluation of Outcomes: Adequate for discharge per MD Physician Treatment Plan for Secondary Diagnosis: Principal Problem:   Depression, major, recurrent, moderate (HCC) Active Problems:   Cocaine use disorder, moderate, dependence (HCC)   Alcohol use disorder, severe, dependence (HCC)   Cannabis use disorder, moderate, dependence (HCC)   Tobacco use disorder   PTSD (post-traumatic stress disorder)   Long Term Goal(s): Improvement in symptoms so as ready for discharge  Short Term Goals: Ability to identify changes in lifestyle to reduce recurrence of condition will improve, Ability to verbalize feelings will improve, Ability to demonstrate self-control will improve and Ability to identify triggers associated with substance abuse/mental health issues will improve  Medication Management: Evaluate patient's response, side effects, and tolerance of medication regimen.  Therapeutic Interventions: 1 to 1 sessions, Unit Group sessions  and Medication administration.  Evaluation of Outcomes: Adequate for discharge   RN Treatment Plan for Primary Diagnosis: Depression, major, recurrent, moderate  (HCC) Long Term Goal(s): Knowledge of disease and therapeutic regimen to maintain health will improve  Short Term Goals: Ability to identify and develop effective coping behaviors will improve  Medication Management: RN will administer medications as ordered by provider, will assess and evaluate patient's response and provide education to patient for prescribed medication. RN will report any adverse and/or side effects to prescribing provider.  Therapeutic Interventions: 1 on 1 counseling sessions, Psychoeducation, Medication administration, Evaluate responses to treatment, Monitor vital signs and CBGs as ordered, Perform/monitor CIWA, COWS, AIMS and Fall Risk screenings as ordered, Perform wound care treatments as ordered.  Evaluation of Outcomes: Adequate for discharge per MD   LCSW Treatment Plan for Primary Diagnosis: Depression, major, recurrent, moderate (HCC) Long Term Goal(s): Safe transition to appropriate next level of care at discharge, Engage patient in therapeutic group addressing interpersonal concerns.  Short Term Goals: Engage patient in aftercare planning with referrals and resources, Increase social support, Increase ability to appropriately verbalize feelings, Increase emotional regulation, Facilitate patient progression through stages of change regarding substance use diagnoses and concerns, Identify triggers associated with mental health/substance abuse issues and Increase skills for wellness and recovery  Therapeutic Interventions: Assess for all discharge needs, 1 to 1 time with Social worker, Explore available resources and support systems, Assess for adequacy in community support network, Educate family and significant other(s) on suicide prevention, Complete Psychosocial Assessment, Interpersonal group therapy.  Evaluation of Outcomes: Adequate for discharge per MD  Progress in Treatment: Attending groups: Yes Participating in groups: Yes Taking medication as  prescribed: Yes, MD continues to assess for medication changes as needed Toleration medication: Yes, no side effects reported at this time Family/Significant other contact made: No, pt refused Patient understands diagnosis: Yes Discussing patient identified problems/goals with staff: Yes Medical problems stabilized or resolved:  Yes   Denies suicidal/homicidal ideation: Yes Issues/concerns per patient self-inventory: None Other: N/A  New problem(s) identified: None identified at this time.   New Short Term/Long Term Goal(s): None identified at this time.   Discharge Plan or Barriers: [place] and will follow up with [facility] for medication management and therapy   Reason for Continuation of Hospitalization: Adequate for discharge per MD   Estimated Length of Stay: 3-5 days   Attendees: Patient:  01/06/2016  10:40 AM  Physician: Dr. Ardyth HarpsHernandez, MD 01/06/2016  10:40 AM  Nursing: Shelia MediaJanet Jones, RN 01/06/2016  10:40 AM  Social Worker: Dorothe PeaJonathan F. Roylene ReasonRiffey, LCSWA, LCAS 01/06/2016  10:40 AM  Social Worker: Hampton AbbotKadijah Grant LCSWA 01/06/2016  10:40 AM  Recreational Therapist: Hershal CoriaBeth Greene, LRT 01/06/2016  10:40 AM   01/06/2016  10:40 AM   01/06/2016  10:40 AM  Other: 01/06/2016  10:40 AM    Scribe for Treatment Team:  Dorothe PeaJonathan F. Almena Hokenson MSW, LCSWA, LCAS
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
## 2016-01-06 NOTE — Progress Notes (Signed)
  [facility] Discharge Plan :  Will you be returning to the same living situation after discharge:  Yes,  pt will be returning to his boarding home in [place] At discharge, do you have transportation home?: Yes,  pt will be provded with a bus pass Do you have the ability to pay for your medications: Yes,  pt will be provided with prescriptions at discharge  Release of information consent forms completed and in the chart;  Patient's signature needed at discharge.  Patient to Follow up at: Follow-up Information    [facility] [facility] .   Why:  Your Peer Support specialist [name] will visit you at your home the day after discharge between the hours of 12noon-5pm for therapy.  Please call [name] at ph: [phone] for questions or assistance. Contact information: [facility] [place], [place] [facility] [address] [place], [place] [address] Phone: [phone] Fax: [phone]         [facility] .   Why:      Please arrive to the walk-in clinic at discharge between the hours of 8am-4pm for your hospital follow up and for medication management, substance abuse treatment and therapy.  Arrive as early as possible for prompt service.  Contact information: [facility] [address] [place], [place] [address] Phone: [phone] Fax: [phone]          Next level of care provider has access to [facility] Link:no  Safety Planning and Suicide Prevention discussed: Yes,  completed with pt  Have you used any form of tobacco in the last 30 days? (Cigarettes, Smokeless Tobacco, Cigars, and/or Pipes): Yes  Has patient been referred to the Quitline?: Patient refused referral  Patient has been referred for addiction treatment: Yes  Dorothe PeaJonathan F Diezel Mazur 01/06/2016, 11:31 AM

## 2016-01-30 ENCOUNTER — Emergency Department
Admission: EM | Admit: 2016-01-30 | Discharge: 2016-01-30 | Disposition: A | Discharge status: Home / Self Care | Payer: Medicaid Other | Attending: Student | Admitting: Student

## 2016-01-30 ENCOUNTER — Encounter: Payer: Self-pay | Admitting: Emergency Medicine

## 2016-01-30 DIAGNOSIS — K6289 Other specified diseases of anus and rectum: Secondary | ICD-10-CM | POA: Diagnosis present

## 2016-01-30 DIAGNOSIS — K611 Rectal abscess: Secondary | ICD-10-CM | POA: Diagnosis not present

## 2016-01-30 DIAGNOSIS — F17210 Nicotine dependence, cigarettes, uncomplicated: Secondary | ICD-10-CM | POA: Insufficient documentation

## 2016-01-30 MED ORDER — POLYETHYLENE GLYCOL 3350 17 GM/SCOOP PO POWD: ORAL | 0 refills | Status: DC

## 2016-01-30 MED ORDER — CLINDAMYCIN HCL 300 MG PO CAPS: 300.0000 mg | ORAL_CAPSULE | Freq: Three times a day (TID) | ORAL | 0 refills | Status: AC

## 2016-01-30 MED ORDER — TRAMADOL HCL 50 MG PO TABS: 50.0000 mg | ORAL_TABLET | Freq: Four times a day (QID) | ORAL | 0 refills | Status: DC | PRN

## 2016-01-30 NOTE — ED Triage Notes (Signed)
C/O rectal pain x 1 day
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
## 2016-01-30 NOTE — ED Notes (Signed)
AAOx3.  Skin warm and dry.  Ambulates with easy and steady gait. NAD 

## 2016-02-12 ENCOUNTER — Emergency Department
Admission: EM | Admit: 2016-02-12 | Discharge: 2016-02-13 | Disposition: A | Discharge status: Home / Self Care | Payer: Medicaid Other | Attending: Emergency Medicine | Admitting: Emergency Medicine

## 2016-02-12 DIAGNOSIS — F1290 Cannabis use, unspecified, uncomplicated: Secondary | ICD-10-CM | POA: Diagnosis not present

## 2016-02-12 DIAGNOSIS — Z79899 Other long term (current) drug therapy: Secondary | ICD-10-CM | POA: Insufficient documentation

## 2016-02-12 DIAGNOSIS — F1490 Cocaine use, unspecified, uncomplicated: Secondary | ICD-10-CM | POA: Diagnosis not present

## 2016-02-12 DIAGNOSIS — F1910 Other psychoactive substance abuse, uncomplicated: Secondary | ICD-10-CM | POA: Insufficient documentation

## 2016-02-12 DIAGNOSIS — F10239 Alcohol dependence with withdrawal, unspecified: Secondary | ICD-10-CM | POA: Diagnosis present

## 2016-02-12 DIAGNOSIS — F17210 Nicotine dependence, cigarettes, uncomplicated: Secondary | ICD-10-CM | POA: Diagnosis not present

## 2016-02-12 LAB — URINE DRUG SCREEN, QUALITATIVE (ARMC ONLY)
AMPHETAMINES, UR SCREEN: NOT DETECTED
BENZODIAZEPINE, UR SCRN: NOT DETECTED
Barbiturates, Ur Screen: NOT DETECTED
CANNABINOID 50 NG, UR ~~LOC~~: NOT DETECTED
Cocaine Metabolite,Ur ~~LOC~~: NOT DETECTED
MDMA (Ecstasy)Ur Screen: NOT DETECTED
Methadone Scn, Ur: NOT DETECTED
Opiate, Ur Screen: NOT DETECTED
PHENCYCLIDINE (PCP) UR S: NOT DETECTED
Tricyclic, Ur Screen: NOT DETECTED
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
## 2016-02-12 NOTE — ED Triage Notes (Signed)
Patient ambulatory to triage with steady gait, without difficulty or distress noted; pt wants detox for marijuana and alcohol

## 2016-02-13 LAB — ETHANOL: ALCOHOL ETHYL (B): 175 mg/dL — AB (ref <5)

## 2016-02-13 NOTE — ED Notes (Signed)
Report received from Butch, RN 

## 2016-02-13 NOTE — ED Notes (Signed)
PT D/C to [facility] worker at this time. Pt states understanding of D/C instructions, pt states understanding that he will be going to [facility]. NAD noted at this time. Pt is alert and oriented, ambulatory to the lobby at this time.

## 2016-02-13 NOTE — ED Notes (Signed)
This RN escorted patient to the lobby at this time, pt left with Rosanne AshingJim, from [facility] at this time.

## 2016-02-13 NOTE — Discharge Instructions (Signed)
Please proceed directly to [facility] for further treatment.

## 2016-02-13 NOTE — ED Notes (Signed)
Pt's caseworker Marchelle Folksmanda at bedside for a visit with patient. Marchelle Folksmanda states that pt "needs more than a 12 hour detox, I'd like him to have a 72 hr detox or longer if possible". Dr. Lenard LancePaduchowski at bedside at this time. Per Dr. Lenard LancePaduchowski, he will speak with [facility] about getting patient a bed at [facility].

## 2016-02-13 NOTE — ED Notes (Signed)
PT given breakfast tray at this time. Pt sitting up eating, NAD Noted.

## 2016-02-13 NOTE — ED Notes (Signed)
Pt given lunch tray at this time

## 2016-02-13 NOTE — ED Notes (Signed)
Pt continues to be resting in bed at this time. Respirations even and unlabored. Pt noted to be lightly snoring at this time. Will continue to monitor for further patient needs.

## 2016-02-13 NOTE — ED Provider Notes (Signed)
-----------------------------------------   1:47 PM on 02/13/2016 -----------------------------------------  The patient has been accepted to [facility] for further treatment. Patient will be discharged at this time.   Minna AntisKevin Lemoyne Nestor, MD 02/13/16 1348

## 2016-02-13 NOTE — BH Assessment (Signed)
Per The request of ER MD (Dr. Lenard LancePaduchowski), writer spoke with [facility] about possible placement with their Detox Program. Referral information faxed and confirmed it was received.   Received phone call from [facility] ([phone]) stating the patient can be admitted with them. Writer informed patient's nurse Aundra Millet(Megan).

## 2016-02-15 DIAGNOSIS — Z79899 Other long term (current) drug therapy: Secondary | ICD-10-CM | POA: Insufficient documentation

## 2016-02-15 DIAGNOSIS — F321 Major depressive disorder, single episode, moderate: Secondary | ICD-10-CM | POA: Diagnosis not present

## 2016-02-15 DIAGNOSIS — F1099 Alcohol use, unspecified with unspecified alcohol-induced disorder: Secondary | ICD-10-CM | POA: Insufficient documentation

## 2016-02-15 DIAGNOSIS — F17210 Nicotine dependence, cigarettes, uncomplicated: Secondary | ICD-10-CM | POA: Diagnosis not present

## 2016-02-15 DIAGNOSIS — Z046 Encounter for general psychiatric examination, requested by authority: Secondary | ICD-10-CM | POA: Diagnosis present

## 2016-02-15 LAB — URINE DRUG SCREEN, QUALITATIVE (ARMC ONLY)
AMPHETAMINES, UR SCREEN: NOT DETECTED
Barbiturates, Ur Screen: NOT DETECTED
Benzodiazepine, Ur Scrn: POSITIVE — AB
Cannabinoid 50 Ng, Ur ~~LOC~~: POSITIVE — AB
Cocaine Metabolite,Ur ~~LOC~~: NOT DETECTED
MDMA (ECSTASY) UR SCREEN: NOT DETECTED
Methadone Scn, Ur: NOT DETECTED
OPIATE, UR SCREEN: NOT DETECTED
PHENCYCLIDINE (PCP) UR S: NOT DETECTED
Tricyclic, Ur Screen: NOT DETECTED

## 2016-02-15 LAB — COMPREHENSIVE METABOLIC PANEL
ALBUMIN: 4.4 g/dL (ref 3.5–5.0)
ALT: 16 U/L — ABNORMAL LOW (ref 17–63)
AST: 25 U/L (ref 15–41)
Alkaline Phosphatase: 55 U/L (ref 38–126)
Anion gap: 7 (ref 5–15)
BUN: 6 mg/dL (ref 6–20)
CHLORIDE: 107 mmol/L (ref 101–111)
CO2: 30 mmol/L (ref 22–32)
Calcium: 8.9 mg/dL (ref 8.9–10.3)
Creatinine, Ser: 0.8 mg/dL (ref 0.61–1.24)
GFR calc Af Amer: >60 mL/min (ref >60)
GFR calc non Af Amer: >60 mL/min (ref >60)
GLUCOSE: 114 mg/dL — AB (ref 65–99)
POTASSIUM: 3.3 mmol/L — AB (ref 3.5–5.1)
Sodium: 144 mmol/L (ref 135–145)
Total Bilirubin: 1 mg/dL (ref 0.3–1.2)
Total Protein: 7.9 g/dL (ref 6.5–8.1)

## 2016-02-15 LAB — CBC
HEMATOCRIT: 40 % (ref 40.0–52.0)
HEMOGLOBIN: 13.9 g/dL (ref 13.0–18.0)
MCH: 33.5 pg (ref 26.0–34.0)
MCHC: 34.7 g/dL (ref 32.0–36.0)
MCV: 96.4 fL (ref 80.0–100.0)
PLATELETS: 238 10*3/uL (ref 150–440)
RBC: 4.15 MIL/uL — AB (ref 4.40–5.90)
RDW: 12.8 % (ref 11.5–14.5)
WBC: 4.1 10*3/uL (ref 3.8–10.6)

## 2016-02-15 LAB — ETHANOL: Alcohol, Ethyl (B): 194 mg/dL — ABNORMAL HIGH (ref <5)

## 2016-02-15 LAB — SALICYLATE LEVEL: Salicylate Lvl: <7 mg/dL (ref 2.8–30.0)

## 2016-02-15 LAB — ACETAMINOPHEN LEVEL: Acetaminophen (Tylenol), Serum: <10 ug/mL — ABNORMAL LOW (ref 10–30)
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
## 2016-02-15 NOTE — ED Triage Notes (Signed)
Patient recently discharged from inpatient.  Reports went to pick his stuff up and made comments in front of another resident that maybe he would be better off dead.  Brought to ED to make sure he wasn't going to harm himself.

## 2016-02-16 ENCOUNTER — Emergency Department
Admission: EM | Admit: 2016-02-16 | Discharge: 2016-02-16 | Disposition: A | Discharge status: Home / Self Care | Payer: Medicaid Other | Attending: Emergency Medicine | Admitting: Emergency Medicine

## 2016-02-16 DIAGNOSIS — R4689 Other symptoms and signs involving appearance and behavior: Secondary | ICD-10-CM

## 2016-02-16 MED ORDER — VITAMIN B-1 100 MG PO TABS
100.0000 mg | ORAL_TABLET | Freq: Every day | ORAL | Status: DC
  Administered 2016-02-16: 100 mg via ORAL
  Filled 2016-02-16: qty 1

## 2016-02-16 MED ORDER — THIAMINE HCL 100 MG/ML IJ SOLN: 100.0000 mg | Freq: Every day | INTRAMUSCULAR | Status: DC

## 2016-02-16 MED ORDER — TRAZODONE HCL 100 MG PO TABS: 100.0000 mg | ORAL_TABLET | Freq: Every day | ORAL | Status: DC

## 2016-02-16 MED ORDER — LORAZEPAM 2 MG PO TABS
2.0000 mg | ORAL_TABLET | Freq: Once | ORAL | Status: AC
  Administered 2016-02-16: 2 mg via ORAL
  Filled 2016-02-16: qty 1

## 2016-02-16 MED ORDER — OLANZAPINE 5 MG PO TABS
15.0000 mg | ORAL_TABLET | ORAL | Status: AC
  Administered 2016-02-16: 15 mg via ORAL
  Filled 2016-02-16: qty 1

## 2016-02-16 MED ORDER — CYCLOBENZAPRINE HCL 10 MG PO TABS: 5.0000 mg | ORAL_TABLET | Freq: Three times a day (TID) | ORAL | Status: DC | PRN

## 2016-02-16 MED ORDER — MIRTAZAPINE 15 MG PO TABS: 45.0000 mg | ORAL_TABLET | Freq: Every day | ORAL | Status: DC

## 2016-02-16 NOTE — ED Notes (Addendum)
Pt received psych consult via the phone at this time due to not being able to connect to the tele psych monitor. Pt with calm and cooperative at this time.

## 2016-02-16 NOTE — ED Notes (Signed)
Discharge instructions reviewed with patient. Pt denies SI/HI and pain. Belongings returned to patient upon discharge.
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
## 2016-02-16 NOTE — ED Notes (Signed)
Pt given breakfast tray at this time. 

## 2016-02-16 NOTE — [facility] Counselor (Signed)
Writer ran pt by Fransisca KaufmannLaura Davis NP who recommends pt be d/c to follow up with is outpatient MH provider, [facility]. Writer tried to call Dr Pershing ProudSchaevitz at [phone] three times but no answer. Writer spoke w/ Amy RN who will give MD writer's phone number at [facility] [phone] or [phone].  Evette Cristalaroline Paige Brandonlee Navis, [place] Therapeutic Triage Specialist

## 2016-02-16 NOTE — ED Notes (Addendum)
Received call from Paige at [facility] stating nurse practitioner said that patient was OK for discharge. She was given telephone number to speak with Dr. Pershing ProudSchaevitz, who is covering this patient in the [facility] ED. RN also spoke with MD who said that he did not receive phone call from [facility] and [facility].
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
## 2016-02-16 NOTE — ED Notes (Signed)
Pt discharged to lobby

## 2016-02-16 NOTE — ED Notes (Signed)
[facility] at [phone]
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
## 2016-02-16 NOTE — BHH Counselor (Signed)
[facility] [place]. He reports he [facility] w/ [facility] and CSW will handle dispositions for psych patients today.  Evette Cristalaroline Paige Delon [place], [place] Therapeutic Triage Specialist

## 2016-02-16 NOTE — ED Notes (Signed)
Pt currently speaking with SOC. Pt remains calm and cooperative. Maintained on 15 minute checks and observation by security camera for safety.
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
## 2016-02-16 NOTE — ED Notes (Addendum)
Pt is intermittently being loud then laying down on the bed and resting. Pt asking for food and tech explained to patient he had a sandwich earlier. Pt given water. Dr York CeriseForbach to bedside and talking to patient. Pt agreeable to take medication to help him rest.
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
## 2016-02-16 NOTE — ED Notes (Signed)
Pt talking loudly. Cursing. Not happy about being here. Attempted to calm pt and [employer] officer on standby for escalating behavior. Dr York CeriseForbach informed of pt.

## 2016-02-16 NOTE — ED Notes (Signed)

## 2016-02-16 NOTE — ED Provider Notes (Addendum)
-----------------------------------------   2:05 PM on 02/16/2016 -----------------------------------------   Blood pressure 128/83, pulse 72, temperature 98.3 F (36.8 C), temperature source Oral, resp. rate 20, SpO2 98 %.  The patient had no acute events since last update.  Calm and cooperative at this time.  Clinically sober. Evaluated by Dr. Maricela BoSprague as well as behavioral health at [place] [place], who both felt the patient appropriate for discharge. The patient is no longer agitated. He is not suicidal or homicidal. He'll be discharged [facility] where [facility].       Devin Blazeravid Matthew Jigar Zielke, MD 02/16/16 1409  Commitment rescinded by Dr. Maricela BoSprague.    Devin Blazeravid Matthew Letesha Klecker, MD 02/16/16 1414

## 2016-02-16 NOTE — ED Notes (Signed)
Report given to Amy, RN [facility]

## 2016-02-16 NOTE — ED Notes (Signed)
Pt getting dressed for discharge. Clothing searched for contraband before given to patient. Maintained on 15 minute checks and observation by security camera for safety.

## 2016-02-16 NOTE — BH Assessment (Signed)
Writer was unable to complete TTS Consult. Before writer was able to introduce his self, patient stated "I don't want to fucking talk to you." Thus, writer walked away, in order to prevent patient from becoming agitated.

## 2016-03-12 ENCOUNTER — Encounter: Payer: Self-pay | Admitting: *Deleted

## 2016-03-12 DIAGNOSIS — K603 Anal fistula, unspecified: Secondary | ICD-10-CM | POA: Insufficient documentation

## 2016-03-12 HISTORY — DX: Anal fistula, unspecified: K60.30

## 2016-03-12 HISTORY — DX: Anal fistula: K60.3

## 2016-03-13 ENCOUNTER — Ambulatory Visit: Payer: Medicaid Other | Admitting: Certified Registered Nurse Anesthetist

## 2016-03-13 ENCOUNTER — Encounter
Admission: RE | Disposition: A | Discharge status: Home / Self Care | Payer: Self-pay | Source: Ambulatory Visit | Attending: Unknown Physician Specialty

## 2016-03-13 ENCOUNTER — Ambulatory Visit
Admission: RE | Admit: 2016-03-13 | Discharge: 2016-03-13 | Disposition: A | Discharge status: Home / Self Care | Payer: Medicaid Other | Source: Ambulatory Visit | Attending: Unknown Physician Specialty | Admitting: Unknown Physician Specialty

## 2016-03-13 DIAGNOSIS — F4310 Post-traumatic stress disorder, unspecified: Secondary | ICD-10-CM | POA: Diagnosis not present

## 2016-03-13 DIAGNOSIS — Z79899 Other long term (current) drug therapy: Secondary | ICD-10-CM | POA: Insufficient documentation

## 2016-03-13 DIAGNOSIS — F17210 Nicotine dependence, cigarettes, uncomplicated: Secondary | ICD-10-CM | POA: Diagnosis not present

## 2016-03-13 DIAGNOSIS — Z9889 Other specified postprocedural states: Secondary | ICD-10-CM | POA: Insufficient documentation

## 2016-03-13 DIAGNOSIS — K603 Anal fistula: Secondary | ICD-10-CM | POA: Diagnosis present

## 2016-03-13 DIAGNOSIS — K635 Polyp of colon: Secondary | ICD-10-CM | POA: Insufficient documentation

## 2016-03-13 DIAGNOSIS — F329 Major depressive disorder, single episode, unspecified: Secondary | ICD-10-CM | POA: Diagnosis not present

## 2016-03-13 HISTORY — DX: Anal fistula: K60.3

## 2016-03-13 HISTORY — PX: COLONOSCOPY WITH PROPOFOL: SHX5780

## 2016-03-13 HISTORY — DX: Anal fistula, unspecified: K60.30

## 2016-03-13 LAB — URINE DRUG SCREEN, QUALITATIVE (ARMC ONLY)
Amphetamines, Ur Screen: NOT DETECTED
Barbiturates, Ur Screen: NOT DETECTED
Benzodiazepine, Ur Scrn: POSITIVE — AB
Cannabinoid 50 Ng, Ur ~~LOC~~: POSITIVE — AB
Cocaine Metabolite,Ur ~~LOC~~: NOT DETECTED
MDMA (ECSTASY) UR SCREEN: NOT DETECTED
Methadone Scn, Ur: NOT DETECTED
Opiate, Ur Screen: NOT DETECTED
PHENCYCLIDINE (PCP) UR S: NOT DETECTED
Tricyclic, Ur Screen: NOT DETECTED

## 2016-03-13 SURGERY — COLONOSCOPY WITH PROPOFOL
Anesthesia: General

## 2016-03-13 MED ORDER — SODIUM CHLORIDE 0.9 % IV SOLN: INTRAVENOUS | Status: DC

## 2016-03-13 MED ORDER — MIDAZOLAM HCL 2 MG/2ML IJ SOLN
INTRAMUSCULAR | Status: DC | PRN
  Administered 2016-03-13 (×2): 1 mg via INTRAVENOUS

## 2016-03-13 MED ORDER — PROPOFOL 10 MG/ML IV BOLUS
INTRAVENOUS | Status: DC | PRN
  Administered 2016-03-13: 30 mg via INTRAVENOUS
  Administered 2016-03-13: 10 mg via INTRAVENOUS
  Administered 2016-03-13 (×2): 30 mg via INTRAVENOUS

## 2016-03-13 MED ORDER — PROPOFOL 500 MG/50ML IV EMUL
INTRAVENOUS | Status: DC | PRN
  Administered 2016-03-13: 140 ug/kg/min via INTRAVENOUS

## 2016-03-13 MED ORDER — LIDOCAINE HCL (CARDIAC) 20 MG/ML IV SOLN
INTRAVENOUS | Status: DC | PRN
  Administered 2016-03-13: 30 mg via INTRAVENOUS

## 2016-03-13 MED ORDER — SODIUM CHLORIDE 0.9 % IV SOLN
INTRAVENOUS | Status: DC
  Administered 2016-03-13: 13:00:00 via INTRAVENOUS
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
## 2016-03-13 NOTE — Anesthesia Procedure Notes (Signed)
Date/Time: 03/13/2016 2:25 PM Performed by: Ginger CarneMICHELET, Lavaya Defreitas Pre-anesthesia Checklist: Patient identified, Emergency Drugs available, Suction available, Patient being monitored and Timeout performed Patient Re-evaluated:Patient Re-evaluated prior to inductionOxygen Delivery Method: Nasal cannula

## 2016-03-13 NOTE — Anesthesia Preprocedure Evaluation (Signed)
Anesthesia Evaluation  Patient identified by MRN, date of birth, ID band Patient awake    Reviewed: Allergy & Precautions, NPO status , Patient's Chart, lab work & pertinent test results  History of Anesthesia Complications Negative for: history of anesthetic complications  Airway Mallampati: II       Dental  (+) Edentulous Upper, Poor Dentition, Chipped   Pulmonary Current Smoker,           Cardiovascular negative cardio ROS       Neuro/Psych negative neurological ROS     GI/Hepatic negative GI ROS, (+)     substance abuse  marijuana use,   Endo/Other  negative endocrine ROS  Renal/GU negative Renal ROS     Musculoskeletal   Abdominal   Peds  Hematology negative hematology ROS (+)   Anesthesia Other Findings   Reproductive/Obstetrics                             Anesthesia Physical Anesthesia Plan  ASA: II  Anesthesia Plan: General   Post-op Pain Management:    Induction: Intravenous  Airway Management Planned: Nasal Cannula  Additional Equipment:   Intra-op Plan:   Post-operative Plan:   Informed Consent: I have reviewed the patients History and Physical, chart, labs and discussed the procedure including the risks, benefits and alternatives for the proposed anesthesia with the patient or authorized representative who has indicated his/her understanding and acceptance.     Plan Discussed with:   Anesthesia Plan Comments:         Anesthesia Quick Evaluation

## 2016-03-13 NOTE — Transfer of Care (Signed)
Immediate Anesthesia Transfer of Care Note  Patient: Carmelo Suh  Procedure(s) Performed: Procedure(s): COLONOSCOPY WITH PROPOFOL (N/A)  Patient Location: PACU  Anesthesia Type:General  Level of Consciousness: sedated  Airway & Oxygen Therapy: Patient Spontanous Breathing and Patient connected to nasal cannula oxygen  Post-op Assessment: Report given to RN and Post -op Vital signs reviewed and stable  Post vital signs: Reviewed and stable  Last Vitals:  Vitals:   03/13/16 1254 03/13/16 1456  BP: 137/83 (!) 96/59  Pulse: 61 (!) 55  Resp: 20 17  Temp: 36.3 C 36.1 C    Last Pain:  Vitals:   03/13/16 1456  TempSrc: Tympanic         Complications: No apparent anesthesia complications

## 2016-03-13 NOTE — Op Note (Signed)
[name] Procedure Date: 03/13/2016 2:24 PM MRN: [id number] Account #: [id number] Date of Birth: 10/08/1960 Admit Type: Outpatient Age: [age] [facility] ENDO ROOM 3 Gender: Male Note Status: Finalized Procedure:            Colonoscopy Indications:          perianal fistula Providers:            Scot Junobert T. Marykay Mccleod, MD Referring MD:         [facility] (Referring MD) Medicines:            Propofol per Anesthesia Complications:        No immediate complications. Procedure:            Pre-Anesthesia Assessment:                       - After reviewing the risks and benefits, the patient                        was deemed in satisfactory condition to undergo the                        procedure.                       After obtaining informed consent, the colonoscope was                        passed under direct vision. Throughout the procedure,                        the patient's blood pressure, pulse, and oxygen                        saturations were monitored continuously. The                        Colonoscope was introduced through the anus and                        advanced to the the cecum, identified by appendiceal                        orifice and ileocecal valve. The colonoscopy was                        performed without difficulty. The patient tolerated the                        procedure well. The quality of the bowel preparation                        was good. Findings:      A diminutive polyp was found in the sigmoid colon. The polyp was       sessile. The polyp was removed with a jumbo cold forceps. Resection and       retrieval were complete. Small pocket of tissue in perianal area but no       opening seen in anal or anorectal area. No pus seen with gentle       pressure. Some small amt of scaring  in the area.      Internal hemorrhoids were found during endoscopy. The hemorrhoids  were       small and Grade I (internal hemorrhoids that do not prolapse). Impression:           - One diminutive polyp in the sigmoid colon, removed                        with a jumbo cold forceps. Resected and retrieved. Recommendation:       - The findings and recommendations were discussed with                        the patient's family. See specialist if has any flare                        up of perianal inflammation. Scot Junobert T Chinita Schimpf, MD 03/13/2016 2:59:51 PM This report has been signed electronically. Number of Addenda: 0 Note Initiated On: 03/13/2016 2:24 PM Scope Withdrawal Time: 0 hours 13 minutes 29 seconds  Total Procedure Duration: 0 hours 18 minutes 46 seconds       [facility]

## 2016-03-16 ENCOUNTER — Encounter: Payer: Self-pay | Admitting: Unknown Physician Specialty

## 2016-03-17 LAB — SURGICAL PATHOLOGY

## 2016-03-18 NOTE — Anesthesia Postprocedure Evaluation (Signed)
Anesthesia Post Note  Patient: Devin Brandt  Procedure(s) Performed: Procedure(s) (LRB): COLONOSCOPY WITH PROPOFOL (N/A)  Patient location during evaluation: Endoscopy Anesthesia Type: General Level of consciousness: awake and alert Pain management: pain level controlled Vital Signs Assessment: post-procedure vital signs reviewed and stable Respiratory status: spontaneous breathing and respiratory function stable Cardiovascular status: stable Anesthetic complications: no    Last Vitals:  Vitals:   03/13/16 1528 03/13/16 1538  BP: 128/78 129/82  Pulse: 60 (!) 56  Resp: 16 20  Temp:      Last Pain:  Vitals:   03/16/16 0735  TempSrc:   PainSc: 0-No pain                 Jalena Vanderlinden K

## 2016-05-08 ENCOUNTER — Emergency Department
Admission: EM | Admit: 2016-05-08 | Discharge: 2016-05-09 | Disposition: A | Discharge status: Home / Self Care | Payer: Medicaid Other | Attending: Emergency Medicine | Admitting: Emergency Medicine

## 2016-05-08 DIAGNOSIS — F10129 Alcohol abuse with intoxication, unspecified: Secondary | ICD-10-CM | POA: Insufficient documentation

## 2016-05-08 DIAGNOSIS — R55 Syncope and collapse: Secondary | ICD-10-CM | POA: Diagnosis present

## 2016-05-08 DIAGNOSIS — Z5181 Encounter for therapeutic drug level monitoring: Secondary | ICD-10-CM | POA: Diagnosis not present

## 2016-05-08 DIAGNOSIS — F17210 Nicotine dependence, cigarettes, uncomplicated: Secondary | ICD-10-CM | POA: Diagnosis not present

## 2016-05-08 DIAGNOSIS — R45851 Suicidal ideations: Secondary | ICD-10-CM | POA: Diagnosis not present

## 2016-05-08 DIAGNOSIS — F10920 Alcohol use, unspecified with intoxication, uncomplicated: Secondary | ICD-10-CM

## 2016-05-09 ENCOUNTER — Encounter: Payer: Self-pay | Admitting: Emergency Medicine

## 2016-05-09 LAB — URINALYSIS, COMPLETE (UACMP) WITH MICROSCOPIC
BACTERIA UA: NONE SEEN
BILIRUBIN URINE: NEGATIVE
Glucose, UA: NEGATIVE mg/dL
Hgb urine dipstick: NEGATIVE
KETONES UR: NEGATIVE mg/dL
LEUKOCYTES UA: NEGATIVE
NITRITE: NEGATIVE
PH: 6 (ref 5.0–8.0)
Protein, ur: NEGATIVE mg/dL
RBC / HPF: NONE SEEN RBC/hpf (ref 0–5)
SQUAMOUS EPITHELIAL / LPF: NONE SEEN
Specific Gravity, Urine: 1.002 — ABNORMAL LOW (ref 1.005–1.030)
WBC UA: NONE SEEN WBC/hpf (ref 0–5)

## 2016-05-09 LAB — BASIC METABOLIC PANEL
ANION GAP: 8 (ref 5–15)
BUN: 15 mg/dL (ref 6–20)
CHLORIDE: 102 mmol/L (ref 101–111)
CO2: 28 mmol/L (ref 22–32)
Calcium: 9.6 mg/dL (ref 8.9–10.3)
Creatinine, Ser: 0.83 mg/dL (ref 0.61–1.24)
GFR calc Af Amer: >60 mL/min (ref >60)
GLUCOSE: 104 mg/dL — AB (ref 65–99)
POTASSIUM: 4 mmol/L (ref 3.5–5.1)
Sodium: 138 mmol/L (ref 135–145)

## 2016-05-09 LAB — CBC
HEMATOCRIT: 40.1 % (ref 40.0–52.0)
HEMOGLOBIN: 14 g/dL (ref 13.0–18.0)
MCH: 33.1 pg (ref 26.0–34.0)
MCHC: 34.9 g/dL (ref 32.0–36.0)
MCV: 95 fL (ref 80.0–100.0)
Platelets: 238 10*3/uL (ref 150–440)
RBC: 4.22 MIL/uL — ABNORMAL LOW (ref 4.40–5.90)
RDW: 12.1 % (ref 11.5–14.5)
WBC: 5.2 10*3/uL (ref 3.8–10.6)

## 2016-05-09 LAB — URINE DRUG SCREEN, QUALITATIVE (ARMC ONLY)
Amphetamines, Ur Screen: NOT DETECTED
BARBITURATES, UR SCREEN: NOT DETECTED
BENZODIAZEPINE, UR SCRN: NOT DETECTED
CANNABINOID 50 NG, UR ~~LOC~~: NOT DETECTED
Cocaine Metabolite,Ur ~~LOC~~: NOT DETECTED
MDMA (Ecstasy)Ur Screen: NOT DETECTED
METHADONE SCREEN, URINE: NOT DETECTED
Opiate, Ur Screen: NOT DETECTED
Phencyclidine (PCP) Ur S: NOT DETECTED
TRICYCLIC, UR SCREEN: NOT DETECTED

## 2016-05-09 LAB — ETHANOL: ALCOHOL ETHYL (B): 199 mg/dL — AB (ref <5)

## 2016-05-09 LAB — SALICYLATE LEVEL

## 2016-05-09 LAB — ACETAMINOPHEN LEVEL: Acetaminophen (Tylenol), Serum: <10 ug/mL — ABNORMAL LOW (ref 10–30)

## 2016-05-09 NOTE — ED Triage Notes (Signed)
Pt. States while in a store [place] [address], pt. States he got dizzy and had to sit down.  Pt. Appears intoxicated.  Pt. States he wants to die.  He states he does not want to live.
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
## 2016-05-16 ENCOUNTER — Emergency Department
Admission: EM | Admit: 2016-05-16 | Discharge: 2016-05-16 | Disposition: A | Discharge status: Home / Self Care | Payer: Medicaid Other | Source: Home / Self Care

## 2016-05-16 DIAGNOSIS — F10120 Alcohol abuse with intoxication, uncomplicated: Secondary | ICD-10-CM | POA: Insufficient documentation

## 2016-05-16 DIAGNOSIS — Z5321 Procedure and treatment not carried out due to patient leaving prior to being seen by health care provider: Secondary | ICD-10-CM | POA: Insufficient documentation

## 2016-05-16 DIAGNOSIS — Z79899 Other long term (current) drug therapy: Secondary | ICD-10-CM | POA: Insufficient documentation

## 2016-05-16 DIAGNOSIS — F339 Major depressive disorder, recurrent, unspecified: Secondary | ICD-10-CM | POA: Insufficient documentation

## 2016-05-16 DIAGNOSIS — Z046 Encounter for general psychiatric examination, requested by authority: Secondary | ICD-10-CM | POA: Insufficient documentation

## 2016-05-16 DIAGNOSIS — F17210 Nicotine dependence, cigarettes, uncomplicated: Secondary | ICD-10-CM | POA: Insufficient documentation

## 2016-05-16 NOTE — ED Notes (Signed)
Pt here voluntarily with [employer] officer; pt intoxicated; becoming loud and cursing; pt escorted outside the [facility] by Clinical biochemistofficer and officer on duty

## 2016-05-16 NOTE — ED Notes (Signed)
Patient cussing at staff and refusing to answer questions.

## 2016-05-16 NOTE — ED Triage Notes (Signed)
Patient states that he is here from detox for drugs and alcohol.

## 2016-05-17 ENCOUNTER — Encounter: Payer: Self-pay | Admitting: Emergency Medicine

## 2016-05-17 ENCOUNTER — Emergency Department
Admission: EM | Admit: 2016-05-17 | Discharge: 2016-05-17 | Disposition: A | Discharge status: Home / Self Care | Payer: Medicaid Other | Attending: Emergency Medicine | Admitting: Emergency Medicine

## 2016-05-17 DIAGNOSIS — F339 Major depressive disorder, recurrent, unspecified: Secondary | ICD-10-CM

## 2016-05-17 DIAGNOSIS — F10920 Alcohol use, unspecified with intoxication, uncomplicated: Secondary | ICD-10-CM

## 2016-05-17 LAB — COMPREHENSIVE METABOLIC PANEL
ALT: 17 U/L (ref 17–63)
AST: 27 U/L (ref 15–41)
Albumin: 4.5 g/dL (ref 3.5–5.0)
Alkaline Phosphatase: 53 U/L (ref 38–126)
Anion gap: 10 (ref 5–15)
BILIRUBIN TOTAL: 0.5 mg/dL (ref 0.3–1.2)
BUN: 13 mg/dL (ref 6–20)
CO2: 28 mmol/L (ref 22–32)
CREATININE: 0.98 mg/dL (ref 0.61–1.24)
Calcium: 9.4 mg/dL (ref 8.9–10.3)
Chloride: 105 mmol/L (ref 101–111)
Glucose, Bld: 75 mg/dL (ref 65–99)
Potassium: 3.4 mmol/L — ABNORMAL LOW (ref 3.5–5.1)
Sodium: 143 mmol/L (ref 135–145)
TOTAL PROTEIN: 8 g/dL (ref 6.5–8.1)

## 2016-05-17 LAB — CBC
HCT: 39.2 % — ABNORMAL LOW (ref 40.0–52.0)
HEMOGLOBIN: 13.6 g/dL (ref 13.0–18.0)
MCH: 33.1 pg (ref 26.0–34.0)
MCHC: 34.6 g/dL (ref 32.0–36.0)
MCV: 95.7 fL (ref 80.0–100.0)
Platelets: 268 10*3/uL (ref 150–440)
RBC: 4.1 MIL/uL — ABNORMAL LOW (ref 4.40–5.90)
RDW: 12.4 % (ref 11.5–14.5)
WBC: 5.2 10*3/uL (ref 3.8–10.6)

## 2016-05-17 LAB — ACETAMINOPHEN LEVEL: Acetaminophen (Tylenol), Serum: <10 ug/mL — ABNORMAL LOW (ref 10–30)

## 2016-05-17 LAB — URINE DRUG SCREEN, QUALITATIVE (ARMC ONLY)
AMPHETAMINES, UR SCREEN: NOT DETECTED
BARBITURATES, UR SCREEN: NOT DETECTED
BENZODIAZEPINE, UR SCRN: NOT DETECTED
Cannabinoid 50 Ng, Ur ~~LOC~~: NOT DETECTED
Cocaine Metabolite,Ur ~~LOC~~: NOT DETECTED
MDMA (Ecstasy)Ur Screen: NOT DETECTED
METHADONE SCREEN, URINE: NOT DETECTED
Opiate, Ur Screen: NOT DETECTED
Phencyclidine (PCP) Ur S: NOT DETECTED
TRICYCLIC, UR SCREEN: NOT DETECTED

## 2016-05-17 LAB — ETHANOL: ALCOHOL ETHYL (B): 235 mg/dL — AB (ref <5)

## 2016-05-17 LAB — SALICYLATE LEVEL

## 2016-05-17 MED ORDER — M.V.I. ADULT IV INJ
INJECTION | Freq: Once | INTRAVENOUS | Status: AC
  Administered 2016-05-17: 02:00:00 via INTRAVENOUS
  Filled 2016-05-17: qty 1000

## 2016-05-17 MED ORDER — VITAMIN B-1 100 MG PO TABS
100.0000 mg | ORAL_TABLET | Freq: Every day | ORAL | Status: DC
  Administered 2016-05-17: 100 mg via ORAL
  Filled 2016-05-17: qty 1

## 2016-05-17 MED ORDER — LORAZEPAM 2 MG PO TABS: 0.0000 mg | ORAL_TABLET | Freq: Four times a day (QID) | ORAL | Status: DC

## 2016-05-17 NOTE — ED Provider Notes (Signed)
-----------------------------------------   10:51 AM on 05/17/2016 -----------------------------------------   Blood pressure 101/60, pulse 67, temperature 98.2 F (36.8 C), temperature source Oral, resp. rate 16, height 5\' 9"  (1.753 m), weight 160 lb (72.6 kg), SpO2 95 %.  Patient has been evaluated by psychiatry and cleared for discharge. IVC lifted by Dr. Garnetta BuddyFaheem, telepsychiatry. Patient's labs have been reviewed with no acute findings. Patient will be discharged at this time to home     [place] Siek, MD 05/17/16 1051

## 2016-05-17 NOTE — ED Triage Notes (Signed)
Patient brought in by ems. Per ems patient stating SI.

## 2016-05-17 NOTE — Discharge Instructions (Signed)
You have been seen in the Emergency Department (ED)  today for a psychiatric complaint.  You have been evaluated by psychiatry and we believe you are safe to be discharged from the hospital.   ° °Please return to the Emergency Department (ED)  immediately if you have ANY thoughts of hurting yourself or anyone else, so that we may help you. ° °Please avoid alcohol and drug use. ° °Follow up with your doctor and/or therapist as soon as possible regarding today's ED  visit.  ° °You may call crisis hotline for [place] at [phone]. ° °
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
## 2016-05-17 NOTE — [facility] Counselor (Signed)
Pt intoxicated and not responding to questions.  Unable to complete consult at this time.

## 2016-05-17 NOTE — ED Notes (Signed)
Pt brought to room 20 H from triage via wheelchair. Pt assisted x 2 with getting to the bathroom and to the bed. Pt responsive but refusing to answer questions for this RN. Pt reported to be intoxicated by [employer] officer.

## 2016-05-17 NOTE — ED Notes (Signed)
Pt currently completing [place]

## 2016-05-17 NOTE — ED Notes (Signed)
IV removed from patient. Therapy completed. Site clean, dry, and intact. Catheter intact upon removal
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
## 2016-05-17 NOTE — ED Notes (Signed)

## 2016-06-08 ENCOUNTER — Encounter: Payer: Self-pay | Admitting: Emergency Medicine

## 2016-06-08 ENCOUNTER — Emergency Department: Payer: Medicaid Other

## 2016-06-08 ENCOUNTER — Emergency Department
Admission: EM | Admit: 2016-06-08 | Discharge: 2016-06-08 | Discharge status: Against Medical Advice | Payer: Medicaid Other | Attending: Emergency Medicine | Admitting: Emergency Medicine

## 2016-06-08 DIAGNOSIS — Z79899 Other long term (current) drug therapy: Secondary | ICD-10-CM | POA: Diagnosis not present

## 2016-06-08 DIAGNOSIS — F17210 Nicotine dependence, cigarettes, uncomplicated: Secondary | ICD-10-CM | POA: Insufficient documentation

## 2016-06-08 DIAGNOSIS — K6289 Other specified diseases of anus and rectum: Secondary | ICD-10-CM | POA: Diagnosis present

## 2016-06-08 DIAGNOSIS — F1290 Cannabis use, unspecified, uncomplicated: Secondary | ICD-10-CM | POA: Insufficient documentation

## 2016-06-08 DIAGNOSIS — L03315 Cellulitis of perineum: Secondary | ICD-10-CM | POA: Diagnosis not present

## 2016-06-08 DIAGNOSIS — L02215 Cutaneous abscess of perineum: Secondary | ICD-10-CM | POA: Insufficient documentation

## 2016-06-08 DIAGNOSIS — F1490 Cocaine use, unspecified, uncomplicated: Secondary | ICD-10-CM | POA: Diagnosis not present

## 2016-06-08 LAB — BASIC METABOLIC PANEL
Anion gap: 6 (ref 5–15)
BUN: 13 mg/dL (ref 6–20)
CHLORIDE: 104 mmol/L (ref 101–111)
CO2: 28 mmol/L (ref 22–32)
CREATININE: 0.95 mg/dL (ref 0.61–1.24)
Calcium: 8.5 mg/dL — ABNORMAL LOW (ref 8.9–10.3)
GFR calc Af Amer: >60 mL/min (ref >60)
GFR calc non Af Amer: >60 mL/min (ref >60)
GLUCOSE: 90 mg/dL (ref 65–99)
POTASSIUM: 3.6 mmol/L (ref 3.5–5.1)
SODIUM: 138 mmol/L (ref 135–145)

## 2016-06-08 LAB — CBC WITH DIFFERENTIAL/PLATELET
Basophils Absolute: 0 K/uL (ref 0–0.1)
Basophils Relative: 0 %
Eosinophils Absolute: 0.1 K/uL (ref 0–0.7)
Eosinophils Relative: 2 %
HCT: 37.1 % — ABNORMAL LOW (ref 40.0–52.0)
Hemoglobin: 12.8 g/dL — ABNORMAL LOW (ref 13.0–18.0)
Lymphocytes Relative: 18 %
Lymphs Abs: 1.1 K/uL (ref 1.0–3.6)
MCH: 33 pg (ref 26.0–34.0)
MCHC: 34.5 g/dL (ref 32.0–36.0)
MCV: 95.7 fL (ref 80.0–100.0)
Monocytes Absolute: 0.5 K/uL (ref 0.2–1.0)
Monocytes Relative: 9 %
Neutro Abs: 4.3 K/uL (ref 1.4–6.5)
Neutrophils Relative %: 71 %
Platelets: 225 K/uL (ref 150–440)
RBC: 3.88 MIL/uL — ABNORMAL LOW (ref 4.40–5.90)
RDW: 12.6 % (ref 11.5–14.5)
WBC: 6 K/uL (ref 3.8–10.6)

## 2016-06-08 LAB — URINE DRUG SCREEN, QUALITATIVE (ARMC ONLY)
Amphetamines, Ur Screen: NOT DETECTED
Barbiturates, Ur Screen: NOT DETECTED
Benzodiazepine, Ur Scrn: NOT DETECTED
Cannabinoid 50 Ng, Ur ~~LOC~~: POSITIVE — AB
Cocaine Metabolite,Ur ~~LOC~~: POSITIVE — AB
MDMA (Ecstasy)Ur Screen: NOT DETECTED
Methadone Scn, Ur: NOT DETECTED
Opiate, Ur Screen: POSITIVE — AB
Phencyclidine (PCP) Ur S: NOT DETECTED
Tricyclic, Ur Screen: NOT DETECTED

## 2016-06-08 LAB — ETHANOL: Alcohol, Ethyl (B): <5 mg/dL (ref <5)

## 2016-06-08 LAB — LACTIC ACID, PLASMA: LACTIC ACID, VENOUS: 0.9 mmol/L (ref 0.5–1.9)

## 2016-06-08 MED ORDER — IOPAMIDOL (ISOVUE-300) INJECTION 61%
15.0000 mL | INTRAVENOUS | Status: AC
  Administered 2016-06-08: 15 mL via ORAL

## 2016-06-08 MED ORDER — PIPERACILLIN-TAZOBACTAM 3.375 G IVPB 30 MIN: 3.3750 g | Freq: Three times a day (TID) | INTRAVENOUS | Status: DC

## 2016-06-08 MED ORDER — IOPAMIDOL (ISOVUE-300) INJECTION 61%
100.0000 mL | Freq: Once | INTRAVENOUS | Status: AC | PRN
  Administered 2016-06-08: 100 mL via INTRAVENOUS

## 2016-06-08 MED ORDER — SODIUM CHLORIDE 0.9 % IV BOLUS (SEPSIS)
1000.0000 mL | Freq: Once | INTRAVENOUS | Status: AC
  Administered 2016-06-08: 1000 mL via INTRAVENOUS

## 2016-06-08 MED ORDER — PIPERACILLIN-TAZOBACTAM 3.375 G IVPB 30 MIN
3.3750 g | Freq: Once | INTRAVENOUS | Status: AC
  Administered 2016-06-08: 3.375 g via INTRAVENOUS
  Filled 2016-06-08: qty 50

## 2016-06-08 MED ORDER — SULFAMETHOXAZOLE-TRIMETHOPRIM 800-160 MG PO TABS: 1.0000 | ORAL_TABLET | Freq: Two times a day (BID) | ORAL | 0 refills | Status: DC

## 2016-06-08 MED ORDER — MORPHINE SULFATE (PF) 4 MG/ML IV SOLN
4.0000 mg | Freq: Once | INTRAVENOUS | Status: AC
  Administered 2016-06-08: 4 mg via INTRAVENOUS
  Filled 2016-06-08: qty 1
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
## 2016-06-08 NOTE — ED Notes (Signed)
Pt does not wish to be admitted, nor go through with surgery at this time.  Pt states, "I need more time to think this over."  Surgeon and ED MD notified.

## 2016-06-08 NOTE — ED Notes (Signed)
Patient transported to CT 
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
## 2016-06-08 NOTE — ED Triage Notes (Signed)
Rectal and scrotal pain x 3 days, states had surgery for fistula 7 months ago, has had intermittent pain since.

## 2016-06-08 NOTE — ED Notes (Signed)
Pt unable to provide urine specimen at this time; states he cannot go right now.

## 2016-06-08 NOTE — Discharge Instructions (Signed)
As I explained to you you have an infection in your groin area. We recommended that you stay for IV antibiotics and admission to the hospital. You may return at any time if you wish to continue treatment. Follow-up with your doctor immediately. Return to the emergency room if you have a fever, worsening pain, worsening redness.

## 2016-06-08 NOTE — ED Triage Notes (Signed)
Pt reports hx of anal fistula, reports anal pain and pain into testicles.

## 2016-06-09 ENCOUNTER — Observation Stay: Payer: Medicaid Other | Admitting: Anesthesiology

## 2016-06-09 ENCOUNTER — Observation Stay
Admission: EM | Admit: 2016-06-09 | Discharge: 2016-06-10 | Disposition: A | Discharge status: Home / Self Care | Payer: Medicaid Other | Attending: Surgery | Admitting: Surgery

## 2016-06-09 ENCOUNTER — Encounter
Admission: EM | Disposition: A | Discharge status: Home / Self Care | Payer: Self-pay | Source: Home / Self Care | Attending: Emergency Medicine

## 2016-06-09 ENCOUNTER — Encounter: Payer: Self-pay | Admitting: Emergency Medicine

## 2016-06-09 DIAGNOSIS — N492 Inflammatory disorders of scrotum: Secondary | ICD-10-CM | POA: Diagnosis not present

## 2016-06-09 DIAGNOSIS — F17210 Nicotine dependence, cigarettes, uncomplicated: Secondary | ICD-10-CM | POA: Insufficient documentation

## 2016-06-09 DIAGNOSIS — F1220 Cannabis dependence, uncomplicated: Secondary | ICD-10-CM | POA: Diagnosis not present

## 2016-06-09 DIAGNOSIS — Z9889 Other specified postprocedural states: Secondary | ICD-10-CM | POA: Insufficient documentation

## 2016-06-09 DIAGNOSIS — Z79899 Other long term (current) drug therapy: Secondary | ICD-10-CM | POA: Diagnosis not present

## 2016-06-09 DIAGNOSIS — B951 Streptococcus, group B, as the cause of diseases classified elsewhere: Secondary | ICD-10-CM | POA: Insufficient documentation

## 2016-06-09 DIAGNOSIS — F4310 Post-traumatic stress disorder, unspecified: Secondary | ICD-10-CM | POA: Insufficient documentation

## 2016-06-09 DIAGNOSIS — L02215 Cutaneous abscess of perineum: Secondary | ICD-10-CM | POA: Diagnosis not present

## 2016-06-09 DIAGNOSIS — F331 Major depressive disorder, recurrent, moderate: Secondary | ICD-10-CM | POA: Insufficient documentation

## 2016-06-09 DIAGNOSIS — L03315 Cellulitis of perineum: Secondary | ICD-10-CM | POA: Diagnosis present

## 2016-06-09 DIAGNOSIS — Z823 Family history of stroke: Secondary | ICD-10-CM | POA: Insufficient documentation

## 2016-06-09 DIAGNOSIS — L0291 Cutaneous abscess, unspecified: Secondary | ICD-10-CM

## 2016-06-09 HISTORY — PX: INCISION AND DRAINAGE PERIRECTAL ABSCESS: SHX1804

## 2016-06-09 LAB — CBC
HCT: 36.9 % — ABNORMAL LOW (ref 40.0–52.0)
Hemoglobin: 12.9 g/dL — ABNORMAL LOW (ref 13.0–18.0)
MCH: 33.4 pg (ref 26.0–34.0)
MCHC: 35 g/dL (ref 32.0–36.0)
MCV: 95.4 fL (ref 80.0–100.0)
Platelets: 229 10*3/uL (ref 150–440)
RBC: 3.87 MIL/uL — ABNORMAL LOW (ref 4.40–5.90)
RDW: 12.7 % (ref 11.5–14.5)
WBC: 7.5 10*3/uL (ref 3.8–10.6)

## 2016-06-09 LAB — COMPREHENSIVE METABOLIC PANEL
ALBUMIN: 3.9 g/dL (ref 3.5–5.0)
ALT: 11 U/L — ABNORMAL LOW (ref 17–63)
ANION GAP: 8 (ref 5–15)
AST: 25 U/L (ref 15–41)
Alkaline Phosphatase: 49 U/L (ref 38–126)
BUN: 9 mg/dL (ref 6–20)
CO2: 27 mmol/L (ref 22–32)
Calcium: 8.7 mg/dL — ABNORMAL LOW (ref 8.9–10.3)
Chloride: 104 mmol/L (ref 101–111)
Creatinine, Ser: 0.94 mg/dL (ref 0.61–1.24)
GFR calc Af Amer: >60 mL/min (ref >60)
GFR calc non Af Amer: >60 mL/min (ref >60)
GLUCOSE: 89 mg/dL (ref 65–99)
Potassium: 4 mmol/L (ref 3.5–5.1)
SODIUM: 139 mmol/L (ref 135–145)
TOTAL PROTEIN: 7.5 g/dL (ref 6.5–8.1)
Total Bilirubin: 0.8 mg/dL (ref 0.3–1.2)

## 2016-06-09 LAB — URINE DRUG SCREEN, QUALITATIVE (ARMC ONLY)
AMPHETAMINES, UR SCREEN: NOT DETECTED
BENZODIAZEPINE, UR SCRN: NOT DETECTED
Barbiturates, Ur Screen: NOT DETECTED
Cannabinoid 50 Ng, Ur ~~LOC~~: POSITIVE — AB
Cocaine Metabolite,Ur ~~LOC~~: NOT DETECTED
MDMA (Ecstasy)Ur Screen: NOT DETECTED
Methadone Scn, Ur: NOT DETECTED
Opiate, Ur Screen: NOT DETECTED
PHENCYCLIDINE (PCP) UR S: NOT DETECTED
Tricyclic, Ur Screen: NOT DETECTED

## 2016-06-09 LAB — SURGICAL PCR SCREEN
MRSA, PCR: NEGATIVE
Staphylococcus aureus: POSITIVE — AB

## 2016-06-09 LAB — URINALYSIS, COMPLETE (UACMP) WITH MICROSCOPIC
BACTERIA UA: NONE SEEN
Bilirubin Urine: NEGATIVE
GLUCOSE, UA: NEGATIVE mg/dL
Hgb urine dipstick: NEGATIVE
KETONES UR: NEGATIVE mg/dL
LEUKOCYTES UA: NEGATIVE
Nitrite: NEGATIVE
PROTEIN: NEGATIVE mg/dL
SQUAMOUS EPITHELIAL / LPF: NONE SEEN
Specific Gravity, Urine: 1.008 (ref 1.005–1.030)
WBC, UA: NONE SEEN WBC/hpf (ref 0–5)
pH: 7 (ref 5.0–8.0)

## 2016-06-09 SURGERY — INCISION AND DRAINAGE, ABSCESS, PERIRECTAL
Anesthesia: General

## 2016-06-09 SURGERY — INCISION AND DRAINAGE, ABSCESS, PERIRECTAL
Anesthesia: Choice

## 2016-06-09 MED ORDER — VANCOMYCIN HCL IN DEXTROSE 1-5 GM/200ML-% IV SOLN
1000.0000 mg | Freq: Once | INTRAVENOUS | Status: AC
  Administered 2016-06-09: 1000 mg via INTRAVENOUS
  Filled 2016-06-09 (×2): qty 200

## 2016-06-09 MED ORDER — FENTANYL CITRATE (PF) 100 MCG/2ML IJ SOLN
INTRAMUSCULAR | Status: AC
  Filled 2016-06-09: qty 2

## 2016-06-09 MED ORDER — FENTANYL CITRATE (PF) 100 MCG/2ML IJ SOLN: 25.0000 ug | INTRAMUSCULAR | Status: DC | PRN

## 2016-06-09 MED ORDER — ONDANSETRON HCL 4 MG/2ML IJ SOLN: 4.0000 mg | Freq: Once | INTRAMUSCULAR | Status: DC | PRN

## 2016-06-09 MED ORDER — CYCLOBENZAPRINE HCL 5 MG PO TABS
5.0000 mg | ORAL_TABLET | Freq: Three times a day (TID) | ORAL | Status: DC | PRN
Start: 2016-06-09 — End: 2016-06-10
  Filled 2016-06-09: qty 1

## 2016-06-09 MED ORDER — TRAZODONE HCL 100 MG PO TABS
100.0000 mg | ORAL_TABLET | Freq: Every day | ORAL | Status: DC
  Administered 2016-06-09: 100 mg via ORAL
  Filled 2016-06-09: qty 1

## 2016-06-09 MED ORDER — LACTATED RINGERS IV SOLN
INTRAVENOUS | Status: DC
  Administered 2016-06-09 (×2): via INTRAVENOUS

## 2016-06-09 MED ORDER — MUPIROCIN 2 % EX OINT
1.0000 "application " | TOPICAL_OINTMENT | Freq: Two times a day (BID) | CUTANEOUS | Status: DC
  Administered 2016-06-09: 1 via NASAL
  Filled 2016-06-09: qty 22

## 2016-06-09 MED ORDER — ENOXAPARIN SODIUM 40 MG/0.4ML ~~LOC~~ SOLN
40.0000 mg | SUBCUTANEOUS | Status: DC
  Filled 2016-06-09: qty 0.4

## 2016-06-09 MED ORDER — ONDANSETRON 4 MG PO TBDP: 4.0000 mg | ORAL_TABLET | Freq: Four times a day (QID) | ORAL | Status: DC | PRN

## 2016-06-09 MED ORDER — PIPERACILLIN-TAZOBACTAM 3.375 G IVPB
3.3750 g | Freq: Three times a day (TID) | INTRAVENOUS | Status: DC
  Administered 2016-06-09 – 2016-06-10 (×2): 3.375 g via INTRAVENOUS
  Filled 2016-06-09 (×2): qty 50

## 2016-06-09 MED ORDER — TRAMADOL HCL 50 MG PO TABS: 50.0000 mg | ORAL_TABLET | Freq: Four times a day (QID) | ORAL | Status: DC | PRN

## 2016-06-09 MED ORDER — KETOROLAC TROMETHAMINE 30 MG/ML IJ SOLN: 30.0000 mg | Freq: Four times a day (QID) | INTRAMUSCULAR | Status: DC | PRN

## 2016-06-09 MED ORDER — MORPHINE SULFATE (PF) 2 MG/ML IV SOLN: 2.0000 mg | INTRAVENOUS | Status: DC | PRN

## 2016-06-09 MED ORDER — MIDAZOLAM HCL 2 MG/2ML IJ SOLN
INTRAMUSCULAR | Status: DC | PRN
  Administered 2016-06-09: 2 mg via INTRAVENOUS

## 2016-06-09 MED ORDER — GLYCOPYRROLATE 0.2 MG/ML IJ SOLN
INTRAMUSCULAR | Status: DC | PRN
  Administered 2016-06-09: 0.2 mg via INTRAVENOUS

## 2016-06-09 MED ORDER — ONDANSETRON HCL 4 MG/2ML IJ SOLN
4.0000 mg | Freq: Four times a day (QID) | INTRAMUSCULAR | Status: DC | PRN
  Administered 2016-06-09: 4 mg via INTRAVENOUS

## 2016-06-09 MED ORDER — HYDRALAZINE HCL 20 MG/ML IJ SOLN: 10.0000 mg | INTRAMUSCULAR | Status: DC | PRN

## 2016-06-09 MED ORDER — HYDROMORPHONE HCL 1 MG/ML IJ SOLN
1.0000 mg | INTRAMUSCULAR | Status: AC
  Administered 2016-06-09: 1 mg via INTRAVENOUS
  Filled 2016-06-09: qty 1

## 2016-06-09 MED ORDER — FENTANYL CITRATE (PF) 100 MCG/2ML IJ SOLN
INTRAMUSCULAR | Status: DC | PRN
  Administered 2016-06-09 (×2): 50 ug via INTRAVENOUS

## 2016-06-09 MED ORDER — PIPERACILLIN-TAZOBACTAM 3.375 G IVPB 30 MIN
3.3750 g | Freq: Once | INTRAVENOUS | Status: AC
  Administered 2016-06-09: 3.375 g via INTRAVENOUS
  Filled 2016-06-09: qty 50

## 2016-06-09 MED ORDER — CHLORHEXIDINE GLUCONATE CLOTH 2 % EX PADS
6.0000 | MEDICATED_PAD | Freq: Every day | CUTANEOUS | Status: DC
  Administered 2016-06-09: 6 via TOPICAL

## 2016-06-09 MED ORDER — MIDAZOLAM HCL 2 MG/2ML IJ SOLN
INTRAMUSCULAR | Status: AC
  Filled 2016-06-09: qty 2

## 2016-06-09 SURGICAL SUPPLY — 23 items
BLADE SURG 15 STRL LF DISP TIS (BLADE) ×1 IMPLANT
BLADE SURG 15 STRL SS (BLADE) ×2
BLADE SURG SZ11 CARB STEEL (BLADE) ×3 IMPLANT
BRIEF STRETCH MATERNITY 2XLG (MISCELLANEOUS) IMPLANT
CANISTER SUCT 1200ML W/VALVE (MISCELLANEOUS) IMPLANT
DRAIN PENROSE 1/4X12 LTX (DRAIN) IMPLANT
DRAPE LAPAROTOMY 100X77 ABD (DRAPES) ×3 IMPLANT
DRAPE LEGGINS SURG 28X43 STRL (DRAPES) ×3 IMPLANT
DRAPE UNDER BUTTOCK W/FLU (DRAPES) ×3 IMPLANT
GAUZE IODOFORM PACK 1/2 7832 (GAUZE/BANDAGES/DRESSINGS) ×3 IMPLANT
GAUZE SPONGE 4X4 12PLY STRL (GAUZE/BANDAGES/DRESSINGS) ×3 IMPLANT
GLOVE BIO SURGEON STRL SZ8 (GLOVE) ×9 IMPLANT
GOWN STRL REUS W/ TWL LRG LVL3 (GOWN DISPOSABLE) ×2 IMPLANT
GOWN STRL REUS W/TWL LRG LVL3 (GOWN DISPOSABLE) ×4
KIT RM TURNOVER STRD PROC AR (KITS) ×3 IMPLANT
LABEL OR SOLS (LABEL) IMPLANT
NS IRRIG 500ML POUR BTL (IV SOLUTION) ×3 IMPLANT
PACK BASIN MINOR ARMC (MISCELLANEOUS) ×3 IMPLANT
PAD ABD DERMACEA PRESS 5X9 (GAUZE/BANDAGES/DRESSINGS) IMPLANT
PREP PVP WINGED SPONGE (MISCELLANEOUS) IMPLANT
SUT ETH BLK MONO 3 0 FS 1 12/B (SUTURE) IMPLANT
SUT NYLON 2-0 (SUTURE) IMPLANT
SWAB CULTURE AMIES ANAERIB BLU (MISCELLANEOUS) ×3 IMPLANT

## 2016-06-09 NOTE — ED Triage Notes (Signed)
Patient seen through ED yesterday for c/o groin infection.  Returns today for same.
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
## 2016-06-09 NOTE — Anesthesia Postprocedure Evaluation (Signed)
Anesthesia Post Note  Patient: Devin Brandt  Procedure(s) Performed: Procedure(s) (LRB): IRRIGATION AND DEBRIDEMENT PERIRECTAL ABSCESS (N/A)  Patient location during evaluation: PACU Anesthesia Type: General Level of consciousness: awake and alert Pain management: pain level controlled Vital Signs Assessment: post-procedure vital signs reviewed and stable Respiratory status: spontaneous breathing and respiratory function stable Cardiovascular status: stable Anesthetic complications: no     Last Vitals:  Vitals:   06/09/16 2005 06/09/16 2010  BP: 135/86   Pulse: 70 67  Resp: 16 15  Temp: 36.7 C     Last Pain:  Vitals:   06/09/16 1704  TempSrc: Oral  PainSc:                  KEPHART,WILLIAM K

## 2016-06-09 NOTE — Anesthesia Procedure Notes (Signed)
Procedure Name: LMA Insertion Date/Time: 06/09/2016 7:36 PM Performed by: Waldo LaineJUSTIS, Devin Wolfman Pre-anesthesia Checklist: Patient identified, Patient being monitored, Timeout performed, Emergency Drugs available and Suction available Patient Re-evaluated:Patient Re-evaluated prior to inductionOxygen Delivery Method: Circle system utilized Preoxygenation: Pre-oxygenation with 100% oxygen Intubation Type: IV induction Ventilation: Mask ventilation without difficulty LMA: LMA inserted LMA Size: 5.0 Tube type: Oral Number of attempts: 1 Placement Confirmation: positive ETCO2 and breath sounds checked- equal and bilateral Tube secured with: Tape Dental Injury: Teeth and Oropharynx as per pre-operative assessment

## 2016-06-09 NOTE — ED Triage Notes (Signed)
Pt returns today for admission to hospital for groin infection. Did not stay yesterday because he was hungry and could not go without food. Pt was told he needed IV antibiotics and admission to the hospital.

## 2016-06-09 NOTE — Op Note (Signed)
06/09/2016  8:25 PM  PATIENT:  Devin Brandt  56 y.o. male  PRE-OPERATIVE DIAGNOSIS:  Perineal abscess  POST-OPERATIVE DIAGNOSIS:  Same  PROCEDURE: Examination under anesthesia, incision and drainage of perineal abscess.  SURGEON:  Lattie Hawichard E Desiraye Rolfson MD, FACS   ANESTHESIA:   Gen.   Details of Procedure: This patient along history of recurrent fistulas and perineal abscesses. He presents with a perineal abscess and in the preop area he noted that it did spontaneously drained and that his pain was gone. I examined him and suggested examination under anesthesia and formal drainage. This is discussed with him he understood and agreed to proceed.  Patient was induced general anesthesia placed in the high lithotomy position and prepped draped sterile fashion a surgical timeout was held. Examination under anesthesia demonstrated 2 findings 1 on the right side of the scrotum was a mass with a once millimeter spontaneously draining area. Purulence exuded from it. An incision was made and purulence was cultured. Packing was placed.  A second area of probable fistula formation without much inflammation with granulation tissue was identified on the midline anterior to the anus near the perineal body. This was probed and did not enter the rectum.  No other area of inflammatory process was identified on the scrotum or base of the penis. Packing had been placed in the larger small abscess in the side of the scrotum. This was the area were spontaneous drainage had occurred.  Patient was taken down from lithotomy position after placing dressings and taken back to the PACU in stable condition.   Lattie Hawichard E Shirlean Berman, MD FACS

## 2016-06-09 NOTE — Anesthesia Preprocedure Evaluation (Signed)
Anesthesia Evaluation  Patient identified by MRN, date of birth, ID band Patient awake    Reviewed: Allergy & Precautions, NPO status , Patient's Chart, lab work & pertinent test results  History of Anesthesia Complications Negative for: history of anesthetic complications  Airway Mallampati: II       Dental   Pulmonary Current Smoker,           Cardiovascular negative cardio ROS       Neuro/Psych negative neurological ROS     GI/Hepatic negative GI ROS, Neg liver ROS, (+)     substance abuse  cocaine use and marijuana use,   Endo/Other  negative endocrine ROS  Renal/GU negative Renal ROS     Musculoskeletal   Abdominal   Peds  Hematology negative hematology ROS (+)   Anesthesia Other Findings   Reproductive/Obstetrics                             Anesthesia Physical Anesthesia Plan  ASA: III  Anesthesia Plan: General   Post-op Pain Management:    Induction: Intravenous  Airway Management Planned: LMA  Additional Equipment:   Intra-op Plan:   Post-operative Plan:   Informed Consent: I have reviewed the patients History and Physical, chart, labs and discussed the procedure including the risks, benefits and alternatives for the proposed anesthesia with the patient or authorized representative who has indicated his/her understanding and acceptance.     Plan Discussed with:   Anesthesia Plan Comments:         Anesthesia Quick Evaluation

## 2016-06-09 NOTE — Progress Notes (Signed)
Preoperative Review   In the preop holding area the patient states that his pain is gone and that he exhibited spontaneous drainage. Physical exam suggests that he still has a large mass with draining through a pinhole near his scrotum base. It is still quite tender and draining frank purulence with a foul odor.  I'm recommending that we proceed to the operating room as Dr. Dahlia Byes had suggested with EUA and I&D.  Patient is met in the preoperative holding area. The history is reviewed in the chart and with the patient. I personally reviewed the options and rationale as well as the risks of this procedure that have been previously discussed with the patient. All questions asked by the patient and/or family were answered to their satisfaction.  Patient agrees to proceed with this procedure at this time.  Florene Glen M.D. FACS

## 2016-06-09 NOTE — Anesthesia Post-op Follow-up Note (Cosign Needed)
Anesthesia QCDR form completed.        

## 2016-06-09 NOTE — Transfer of Care (Signed)
Immediate Anesthesia Transfer of Care Note  Patient: Devin Brandt  Procedure(s) Performed: Procedure(s): IRRIGATION AND DEBRIDEMENT PERIRECTAL ABSCESS (N/A)  Patient Location: PACU  Anesthesia Type:General  Level of Consciousness: awake and patient cooperative  Airway & Oxygen Therapy: Patient Spontanous Breathing and Patient connected to face mask oxygen  Post-op Assessment: Report given to RN and Post -op Vital signs reviewed and stable  Post vital signs: Reviewed and stable  Last Vitals:  Vitals:   06/09/16 1704 06/09/16 2005  BP: 128/79 135/86  Pulse: 63 70  Resp: 16 16  Temp: 37.1 C 36.7 C    Last Pain:  Vitals:   06/09/16 1704  TempSrc: Oral  PainSc:          Complications: No apparent anesthesia complications
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
## 2016-06-10 ENCOUNTER — Encounter: Payer: Self-pay | Admitting: Surgery

## 2016-06-10 LAB — URINE CULTURE: Culture: NO GROWTH

## 2016-06-10 MED ORDER — METRONIDAZOLE 500 MG PO TABS: 500.0000 mg | ORAL_TABLET | Freq: Three times a day (TID) | ORAL | 0 refills | Status: DC

## 2016-06-10 MED ORDER — CIPROFLOXACIN HCL 500 MG PO TABS: 500.0000 mg | ORAL_TABLET | Freq: Two times a day (BID) | ORAL | 0 refills | Status: DC

## 2016-06-10 MED ORDER — HYDROCODONE-ACETAMINOPHEN 5-325 MG PO TABS: 1.0000 | ORAL_TABLET | Freq: Four times a day (QID) | ORAL | 0 refills | Status: DC | PRN

## 2016-06-10 NOTE — Progress Notes (Signed)
06/10/2016 12:02 PM  BP 134/84 (BP Location: Left Arm)   Pulse 63   Temp 98.1 F (36.7 C) (Oral)   Resp 16   Ht 5\' 9"  (1.753 m)   Wt 72.3 kg (159 lb 8 oz)   SpO2 99%   BMI 23.55 kg/m  Patient discharged per MD orders. Discharge instructions reviewed with patient and patient verbalized understanding. IV removed per policy. Prescriptions discussed and given to patient. Perirectal dressing changed and patient educated on dressing change.  Discharged via wheelchair escorted by nursing staff.  Ron ParkerHerron, Ocean Schildt D, RN

## 2016-06-10 NOTE — Discharge Summary (Signed)
Patient ID: Devin MaladyRichard Brandt MRN: [id number] DOB/AGE: 56/05/1960 56 y.o.  Admit date: 06/09/2016 Discharge date: 06/10/2016   Discharge Diagnoses:  Active Problems:   Perineal abscess, superficial   Procedures:I/D perineal abscess  Hospital Course: 56 year old male with history of recurrent perianal abscesses and fistulae pain and comes now with a perineal abscess in need for I&D. He was started on IV antibiotics and was taken promptly to the operating room by Dr. Excell Seltzerooper for an I&D. Small fistulous tract but we left to leave it alone. He did very well postoperatively and at the time of discharge he was ambulating, tolerating regular diet his vital signs were stable and he was afebrile. His physical exam showed a male in no acute distress, awake, alert. Abdomen: Soft, nontender, no peritonitis. Rectal: Evidence of a wound without evidence of any necrotizing infection. No new collections. Extremities: Soft, nontender and well perfused. He will sent home with by mouth antibiotics for a week and follow-up with Dr. Excell Seltzerooper next week. Please note that the patient was also advised about quitting smoking and his polysubstance abuse problems.  Disposition: 01-Home or Self Care  Discharge Instructions    Call MD for:  difficulty breathing, headache or visual disturbances    Complete by:  As directed    Call MD for:  extreme fatigue    Complete by:  As directed    Call MD for:  hives    Complete by:  As directed    Call MD for:  persistant dizziness or light-headedness    Complete by:  As directed    Call MD for:  persistant nausea and vomiting    Complete by:  As directed    Call MD for:  redness, tenderness, or signs of infection (pain, swelling, redness, odor or green/yellow discharge around incision site)    Complete by:  As directed    Call MD for:  severe uncontrolled pain    Complete by:  As directed    Call MD for:  temperature >100.4    Complete by:  As directed    Diet - low sodium  heart healthy    Complete by:  As directed    Discharge instructions    Complete by:  As directed    Apply sanitary Pad perineal area BID. Sitz baths BID   Increase activity slowly    Complete by:  As directed      Allergies as of 06/10/2016   No Known Allergies     Medication List    STOP taking these medications   sulfamethoxazole-trimethoprim 800-160 MG tablet Commonly known as:  BACTRIM DS,SEPTRA DS     TAKE these medications   ciprofloxacin 500 MG tablet Commonly known as:  CIPRO Take 1 tablet (500 mg total) by mouth 2 (two) times daily.   cyclobenzaprine 5 MG tablet Commonly known as:  FLEXERIL Take 1 tablet (5 mg total) by mouth 3 (three) times daily as needed for muscle spasms.   HYDROcodone-acetaminophen 5-325 MG tablet Commonly known as:  NORCO/VICODIN Take 1-2 tablets by mouth every 6 (six) hours as needed for moderate pain.   metroNIDAZOLE 500 MG tablet Commonly known as:  FLAGYL Take 1 tablet (500 mg total) by mouth 3 (three) times daily.   mirtazapine 45 MG tablet Commonly known as:  REMERON Take 1 tablet (45 mg total) by mouth at bedtime.   polyethylene glycol powder powder Commonly known as:  GLYCOLAX/MIRALAX Dissolve 1 tablespoon in 8 ounces of water and drink once daily.  traMADol 50 MG tablet Commonly known as:  ULTRAM Take 1 tablet (50 mg total) by mouth every 6 (six) hours as needed for severe pain.   traZODone 100 MG tablet Commonly known as:  DESYREL Take 1 tablet (100 mg total) by mouth at bedtime.      Follow-up Information    Dionne Milo, MD. Go on 06/18/2016.   Specialty:  Surgery Why:  Dr. Excell Seltzer, Thursday, 2/22 at 10:15 a.m., [facility] office [phone] Contact information: [address] [place] [address] [phone]            Sterling Big, MD FACS

## 2016-06-12 LAB — AEROBIC CULTURE W GRAM STAIN (SUPERFICIAL SPECIMEN)

## 2016-06-12 LAB — AEROBIC CULTURE  (SUPERFICIAL SPECIMEN)

## 2016-06-14 LAB — ANAEROBIC CULTURE

## 2016-06-18 ENCOUNTER — Ambulatory Visit (INDEPENDENT_AMBULATORY_CARE_PROVIDER_SITE_OTHER): Payer: Medicaid Other | Admitting: Surgery

## 2016-06-18 ENCOUNTER — Encounter: Payer: Self-pay | Admitting: Surgery

## 2016-06-18 VITALS — BP 122/71 | HR 52 | Temp 98.6°F | Ht 69.0 in | Wt 161.6 lb

## 2016-06-18 DIAGNOSIS — K612 Anorectal abscess: Secondary | ICD-10-CM

## 2016-06-18 NOTE — Progress Notes (Signed)
Outpatient postop visit  06/18/2016  Devin Brandt is an 56 y.o. male.    Procedure: I&D of perineal abscess  CC: No pain  HPI: Status post I&D of a perineal abscess on the side of the scrotum. He has no complaints no drainage no fevers or chills he is almost finished his antibiotics.  Medications reviewed.    Physical Exam:  There were no vitals taken for this visit.    PE: Wound is clean no erythema some induration no drainage nontender    Assessment/Plan:  Patient doing very well recommend finish out his antibiotics and follow up on an as-needed basis  Lattie Hawichard E Cooper, MD, FACS

## 2016-06-18 NOTE — Patient Instructions (Signed)
Finish your antibiotics. You are healing well and do not need a follow-up appointment with us today.  Please call if you have any questions or concerns. Also call if you have pain, redness, or warmth that begins once again in this area.

## 2016-07-12 ENCOUNTER — Emergency Department
Admission: EM | Admit: 2016-07-12 | Discharge: 2016-07-12 | Disposition: A | Discharge status: Home / Self Care | Payer: Medicaid Other | Attending: Emergency Medicine | Admitting: Emergency Medicine

## 2016-07-12 ENCOUNTER — Encounter: Payer: Self-pay | Admitting: Emergency Medicine

## 2016-07-12 DIAGNOSIS — Z79899 Other long term (current) drug therapy: Secondary | ICD-10-CM | POA: Diagnosis not present

## 2016-07-12 DIAGNOSIS — F1020 Alcohol dependence, uncomplicated: Secondary | ICD-10-CM | POA: Diagnosis not present

## 2016-07-12 DIAGNOSIS — F17210 Nicotine dependence, cigarettes, uncomplicated: Secondary | ICD-10-CM | POA: Diagnosis not present

## 2016-07-12 DIAGNOSIS — Z046 Encounter for general psychiatric examination, requested by authority: Secondary | ICD-10-CM | POA: Diagnosis present

## 2016-07-12 LAB — CBC
HEMATOCRIT: 40.5 % (ref 40.0–52.0)
Hemoglobin: 14 g/dL (ref 13.0–18.0)
MCH: 33.2 pg (ref 26.0–34.0)
MCHC: 34.5 g/dL (ref 32.0–36.0)
MCV: 96.3 fL (ref 80.0–100.0)
Platelets: 243 10*3/uL (ref 150–440)
RBC: 4.2 MIL/uL — ABNORMAL LOW (ref 4.40–5.90)
RDW: 13.2 % (ref 11.5–14.5)
WBC: 4.2 10*3/uL (ref 3.8–10.6)

## 2016-07-12 LAB — COMPREHENSIVE METABOLIC PANEL
ALK PHOS: 48 U/L (ref 38–126)
ALT: 17 U/L (ref 17–63)
AST: 30 U/L (ref 15–41)
Albumin: 4.5 g/dL (ref 3.5–5.0)
Anion gap: 10 (ref 5–15)
BUN: 10 mg/dL (ref 6–20)
CALCIUM: 9 mg/dL (ref 8.9–10.3)
CHLORIDE: 106 mmol/L (ref 101–111)
CO2: 25 mmol/L (ref 22–32)
CREATININE: 0.84 mg/dL (ref 0.61–1.24)
GFR calc Af Amer: >60 mL/min (ref >60)
Glucose, Bld: 82 mg/dL (ref 65–99)
Potassium: 3.7 mmol/L (ref 3.5–5.1)
Sodium: 141 mmol/L (ref 135–145)
Total Bilirubin: 0.7 mg/dL (ref 0.3–1.2)
Total Protein: 8.4 g/dL — ABNORMAL HIGH (ref 6.5–8.1)

## 2016-07-12 LAB — URINE DRUG SCREEN, QUALITATIVE (ARMC ONLY)
AMPHETAMINES, UR SCREEN: NOT DETECTED
Barbiturates, Ur Screen: NOT DETECTED
Benzodiazepine, Ur Scrn: NOT DETECTED
COCAINE METABOLITE, UR ~~LOC~~: NOT DETECTED
Cannabinoid 50 Ng, Ur ~~LOC~~: POSITIVE — AB
MDMA (ECSTASY) UR SCREEN: NOT DETECTED
METHADONE SCREEN, URINE: NOT DETECTED
OPIATE, UR SCREEN: NOT DETECTED
Phencyclidine (PCP) Ur S: NOT DETECTED
Tricyclic, Ur Screen: NOT DETECTED

## 2016-07-12 LAB — ETHANOL: ALCOHOL ETHYL (B): 198 mg/dL — AB (ref <5)

## 2016-07-12 NOTE — [facility] Counselor (Signed)
Patient presenting to the ED for alcohol detox.  Pt BAC 190, UDS positive for cannabis.  Spoke with Patsy at [facility] who reports that they have male beds available.  Advised pt that [facility] has beds available and obtained his consent to submit his information for review.

## 2016-07-12 NOTE — ED Triage Notes (Signed)
Pt states that he came in for ETOH detox. Pt states that he has been out of his depression medication for a while. Pt is cursing and upset in triage about the way he was treated by [employer]. Pt is unable to concentrate in triage and is raising his voice about unfair treatment.
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
## 2016-07-12 NOTE — ED Notes (Signed)
Pt denies SI/HI at this time.  

## 2016-07-12 NOTE — Discharge Instructions (Signed)
If you're serious about stopping drinking please go to 60 [employer] meetings near for 60 days. Return to the emergency department for any concerns.  It was a pleasure to take care of you today, and thank you for coming to our emergency department.  If you have any questions or concerns before leaving please ask the nurse to grab me and I'm more than happy to go through your aftercare instructions again.  If you were prescribed any opioid pain medication today such as Norco, Vicodin, Percocet, morphine, hydrocodone, or oxycodone please make sure you do not drive when you are taking this medication as it can alter your ability to drive safely.  If you have any concerns once you are home that you are not improving or are in fact getting worse before you can make it to your follow-up appointment, please do not hesitate to call 911 and come back for further evaluation.  Merrily BrittleNeil Dashanique Brownstein MD  Results for orders placed or performed during the hospital encounter of 07/12/16  Comprehensive metabolic panel  Result Value Ref Range   Sodium 141 135 - 145 mmol/L   Potassium 3.7 3.5 - 5.1 mmol/L   Chloride 106 101 - 111 mmol/L   CO2 25 22 - 32 mmol/L   Glucose, Bld 82 65 - 99 mg/dL   BUN 10 6 - 20 mg/dL   Creatinine, Ser 1.610.84 0.61 - 1.24 mg/dL   Calcium 9.0 8.9 - 09.610.3 mg/dL   Total Protein 8.4 (H) 6.5 - 8.1 g/dL   Albumin 4.5 3.5 - 5.0 g/dL   AST 30 15 - 41 U/L   ALT 17 17 - 63 U/L   Alkaline Phosphatase 48 38 - 126 U/L   Total Bilirubin 0.7 0.3 - 1.2 mg/dL   GFR calc non Af Amer >60 >60 mL/min   GFR calc Af Amer >60 >60 mL/min   Anion gap 10 5 - 15  Ethanol  Result Value Ref Range   Alcohol, Ethyl (B) 198 (H) <5 mg/dL  cbc  Result Value Ref Range   WBC 4.2 3.8 - 10.6 K/uL   RBC 4.20 (L) 4.40 - 5.90 MIL/uL   Hemoglobin 14.0 13.0 - 18.0 g/dL   HCT 04.540.5 40.940.0 - 81.152.0 %   MCV 96.3 80.0 - 100.0 fL   MCH 33.2 26.0 - 34.0 pg   MCHC 34.5 32.0 - 36.0 g/dL   RDW 91.413.2 78.211.5 - 95.614.5 %   Platelets 243 150 - 440  K/uL  Urine Drug Screen, Qualitative  Result Value Ref Range   Tricyclic, Ur Screen NONE DETECTED NONE DETECTED   Amphetamines, Ur Screen NONE DETECTED NONE DETECTED   MDMA (Ecstasy)Ur Screen NONE DETECTED NONE DETECTED   Cocaine Metabolite,Ur [place] NONE DETECTED NONE DETECTED   Opiate, Ur Screen NONE DETECTED NONE DETECTED   Phencyclidine (PCP) Ur S NONE DETECTED NONE DETECTED   Cannabinoid 50 Ng, Ur [place] POSITIVE (A) NONE DETECTED   Barbiturates, Ur Screen NONE DETECTED NONE DETECTED   Benzodiazepine, Ur Scrn NONE DETECTED NONE DETECTED   Methadone Scn, Ur NONE DETECTED NONE DETECTED

## 2016-08-11 ENCOUNTER — Emergency Department: Payer: Medicaid Other

## 2016-08-11 ENCOUNTER — Encounter: Payer: Self-pay | Admitting: Emergency Medicine

## 2016-08-11 ENCOUNTER — Emergency Department
Admission: EM | Admit: 2016-08-11 | Discharge: 2016-08-11 | Disposition: A | Discharge status: Home / Self Care | Payer: Medicaid Other | Attending: Emergency Medicine | Admitting: Emergency Medicine

## 2016-08-11 DIAGNOSIS — K6289 Other specified diseases of anus and rectum: Secondary | ICD-10-CM

## 2016-08-11 DIAGNOSIS — Z79899 Other long term (current) drug therapy: Secondary | ICD-10-CM | POA: Diagnosis not present

## 2016-08-11 DIAGNOSIS — K625 Hemorrhage of anus and rectum: Secondary | ICD-10-CM | POA: Diagnosis present

## 2016-08-11 DIAGNOSIS — F17210 Nicotine dependence, cigarettes, uncomplicated: Secondary | ICD-10-CM | POA: Insufficient documentation

## 2016-08-11 DIAGNOSIS — K610 Anal abscess: Secondary | ICD-10-CM | POA: Diagnosis not present

## 2016-08-11 LAB — BASIC METABOLIC PANEL
ANION GAP: 8 (ref 5–15)
BUN: 22 mg/dL — ABNORMAL HIGH (ref 6–20)
CALCIUM: 9.1 mg/dL (ref 8.9–10.3)
CO2: 27 mmol/L (ref 22–32)
Chloride: 108 mmol/L (ref 101–111)
Creatinine, Ser: 0.92 mg/dL (ref 0.61–1.24)
GFR calc Af Amer: >60 mL/min (ref >60)
GLUCOSE: 84 mg/dL (ref 65–99)
Potassium: 3.7 mmol/L (ref 3.5–5.1)
Sodium: 143 mmol/L (ref 135–145)

## 2016-08-11 LAB — CBC WITH DIFFERENTIAL/PLATELET
Basophils Absolute: 0 10*3/uL (ref 0–0.1)
Basophils Relative: 1 %
EOS PCT: 5 %
Eosinophils Absolute: 0.2 10*3/uL (ref 0–0.7)
HCT: 38 % — ABNORMAL LOW (ref 40.0–52.0)
Hemoglobin: 13 g/dL (ref 13.0–18.0)
LYMPHS ABS: 1.3 10*3/uL (ref 1.0–3.6)
LYMPHS PCT: 31 %
MCH: 33 pg (ref 26.0–34.0)
MCHC: 34.3 g/dL (ref 32.0–36.0)
MCV: 96.4 fL (ref 80.0–100.0)
MONO ABS: 0.4 10*3/uL (ref 0.2–1.0)
MONOS PCT: 11 %
Neutro Abs: 2.2 10*3/uL (ref 1.4–6.5)
Neutrophils Relative %: 54 %
PLATELETS: 236 10*3/uL (ref 150–440)
RBC: 3.95 MIL/uL — AB (ref 4.40–5.90)
RDW: 12.8 % (ref 11.5–14.5)
WBC: 4.2 10*3/uL (ref 3.8–10.6)

## 2016-08-11 LAB — TYPE AND SCREEN
ABO/RH(D): O POS
Antibody Screen: NEGATIVE

## 2016-08-11 LAB — PROTIME-INR
INR: 0.99
Prothrombin Time: 13.1 seconds (ref 11.4–15.2)

## 2016-08-11 LAB — APTT: APTT: 30 s (ref 24–36)

## 2016-08-11 MED ORDER — SULFAMETHOXAZOLE-TRIMETHOPRIM 800-160 MG PO TABS: 1.0000 | ORAL_TABLET | Freq: Two times a day (BID) | ORAL | 0 refills | Status: DC

## 2016-08-11 MED ORDER — IOPAMIDOL (ISOVUE-300) INJECTION 61%
100.0000 mL | Freq: Once | INTRAVENOUS | Status: AC | PRN
  Administered 2016-08-11: 100 mL via INTRAVENOUS

## 2016-08-11 MED ORDER — SODIUM CHLORIDE 0.9 % IV BOLUS (SEPSIS)
1000.0000 mL | Freq: Once | INTRAVENOUS | Status: AC
  Administered 2016-08-11: 1000 mL via INTRAVENOUS

## 2016-08-11 MED ORDER — PIPERACILLIN-TAZOBACTAM 3.375 G IVPB 30 MIN: 3.3750 g | Freq: Once | INTRAVENOUS | Status: DC

## 2016-08-11 MED ORDER — MORPHINE SULFATE (PF) 4 MG/ML IV SOLN
4.0000 mg | Freq: Once | INTRAVENOUS | Status: AC
  Administered 2016-08-11: 4 mg via INTRAVENOUS
  Filled 2016-08-11: qty 1

## 2016-08-11 MED ORDER — METRONIDAZOLE 500 MG PO TABS: 500.0000 mg | ORAL_TABLET | Freq: Three times a day (TID) | ORAL | 0 refills | Status: DC

## 2016-08-11 MED ORDER — NAPROXEN 500 MG PO TABS: 500.0000 mg | ORAL_TABLET | Freq: Two times a day (BID) | ORAL | 0 refills | Status: DC

## 2016-08-11 NOTE — ED Triage Notes (Signed)
Pt to ed with c/o rectal pain and swelling x 2 days.  Pt states hx of fistula and surgery for same.  Pt reports he has noticed blood in stool x 2 days.

## 2016-08-11 NOTE — ED Notes (Signed)
Signature pad in room did not work and pt refused to wait on paper copy to sign - pt was discharged with his instructions and his prescriptions with verbalization of understanding
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
## 2016-08-11 NOTE — ED Notes (Signed)
Pt very argumentative with staff and hateful in nature about having to be in the ER and having test ran - advised pt what test were ordered and why
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
## 2016-08-12 ENCOUNTER — Emergency Department
Admission: EM | Admit: 2016-08-12 | Discharge: 2016-08-12 | Disposition: A | Discharge status: Home / Self Care | Payer: Medicaid Other | Attending: Emergency Medicine | Admitting: Emergency Medicine

## 2016-08-12 ENCOUNTER — Encounter: Payer: Self-pay | Admitting: *Deleted

## 2016-08-12 DIAGNOSIS — F1020 Alcohol dependence, uncomplicated: Secondary | ICD-10-CM | POA: Diagnosis present

## 2016-08-12 DIAGNOSIS — F329 Major depressive disorder, single episode, unspecified: Secondary | ICD-10-CM | POA: Insufficient documentation

## 2016-08-12 DIAGNOSIS — F32A Depression, unspecified: Secondary | ICD-10-CM

## 2016-08-12 DIAGNOSIS — F17210 Nicotine dependence, cigarettes, uncomplicated: Secondary | ICD-10-CM | POA: Insufficient documentation

## 2016-08-12 DIAGNOSIS — F1420 Cocaine dependence, uncomplicated: Secondary | ICD-10-CM | POA: Diagnosis present

## 2016-08-12 DIAGNOSIS — F1994 Other psychoactive substance use, unspecified with psychoactive substance-induced mood disorder: Secondary | ICD-10-CM

## 2016-08-12 DIAGNOSIS — Z79899 Other long term (current) drug therapy: Secondary | ICD-10-CM | POA: Diagnosis not present

## 2016-08-12 DIAGNOSIS — F1210 Cannabis abuse, uncomplicated: Secondary | ICD-10-CM | POA: Diagnosis not present

## 2016-08-12 DIAGNOSIS — F4310 Post-traumatic stress disorder, unspecified: Secondary | ICD-10-CM | POA: Diagnosis present

## 2016-08-12 DIAGNOSIS — R45851 Suicidal ideations: Secondary | ICD-10-CM | POA: Diagnosis present

## 2016-08-12 DIAGNOSIS — F10120 Alcohol abuse with intoxication, uncomplicated: Secondary | ICD-10-CM | POA: Diagnosis not present

## 2016-08-12 DIAGNOSIS — F1220 Cannabis dependence, uncomplicated: Secondary | ICD-10-CM | POA: Diagnosis present

## 2016-08-12 DIAGNOSIS — F10920 Alcohol use, unspecified with intoxication, uncomplicated: Secondary | ICD-10-CM

## 2016-08-12 DIAGNOSIS — F1410 Cocaine abuse, uncomplicated: Secondary | ICD-10-CM | POA: Insufficient documentation

## 2016-08-12 LAB — URINE DRUG SCREEN, QUALITATIVE (ARMC ONLY)
Amphetamines, Ur Screen: NOT DETECTED
BARBITURATES, UR SCREEN: NOT DETECTED
BENZODIAZEPINE, UR SCRN: NOT DETECTED
CANNABINOID 50 NG, UR ~~LOC~~: POSITIVE — AB
COCAINE METABOLITE, UR ~~LOC~~: POSITIVE — AB
MDMA (Ecstasy)Ur Screen: NOT DETECTED
METHADONE SCREEN, URINE: NOT DETECTED
OPIATE, UR SCREEN: NOT DETECTED
PHENCYCLIDINE (PCP) UR S: NOT DETECTED
TRICYCLIC, UR SCREEN: NOT DETECTED

## 2016-08-12 LAB — COMPREHENSIVE METABOLIC PANEL
ALK PHOS: 50 U/L (ref 38–126)
ALT: 18 U/L (ref 17–63)
AST: 23 U/L (ref 15–41)
Albumin: 4.8 g/dL (ref 3.5–5.0)
Anion gap: 7 (ref 5–15)
BUN: 11 mg/dL (ref 6–20)
CHLORIDE: 108 mmol/L (ref 101–111)
CO2: 27 mmol/L (ref 22–32)
Calcium: 9.5 mg/dL (ref 8.9–10.3)
Creatinine, Ser: 0.93 mg/dL (ref 0.61–1.24)
Glucose, Bld: 84 mg/dL (ref 65–99)
POTASSIUM: 3.8 mmol/L (ref 3.5–5.1)
Sodium: 142 mmol/L (ref 135–145)
Total Bilirubin: 0.7 mg/dL (ref 0.3–1.2)
Total Protein: 8.1 g/dL (ref 6.5–8.1)

## 2016-08-12 LAB — CBC
HCT: 39.9 % — ABNORMAL LOW (ref 40.0–52.0)
Hemoglobin: 13.9 g/dL (ref 13.0–18.0)
MCH: 33.5 pg (ref 26.0–34.0)
MCHC: 34.8 g/dL (ref 32.0–36.0)
MCV: 96.3 fL (ref 80.0–100.0)
PLATELETS: 247 10*3/uL (ref 150–440)
RBC: 4.15 MIL/uL — AB (ref 4.40–5.90)
RDW: 12.7 % (ref 11.5–14.5)
WBC: 4.2 10*3/uL (ref 3.8–10.6)

## 2016-08-12 LAB — ETHANOL: ALCOHOL ETHYL (B): 197 mg/dL — AB (ref <5)

## 2016-08-12 LAB — ACETAMINOPHEN LEVEL: Acetaminophen (Tylenol), Serum: <10 ug/mL — ABNORMAL LOW (ref 10–30)

## 2016-08-12 LAB — SALICYLATE LEVEL

## 2016-08-12 NOTE — ED Notes (Signed)
Patient assigned to appropriate care area. Patient oriented to unit/care area: Informed that, for their safety, care areas are designed for safety and monitored by security cameras at all times; and visiting hours explained to patient. Patient verbalizes understanding, and verbal contract for safety obtained. 

## 2016-08-12 NOTE — ED Provider Notes (Signed)
-----------------------------------------   3:26 PM on 08/12/2016 -----------------------------------------  The patient has been seen by psychiatry and they believe the patient safe for discharge home. Patient's medical workup has been largely nonrevealing besides an elevated ethanol level. Patient will be discharged home with PCP follow-up. RTS if needed.   Minna Antis, MD 08/12/16 [phone]

## 2016-08-12 NOTE — ED Notes (Signed)
Pt sleeping. Breakfast tray sat at sink area.

## 2016-08-12 NOTE — ED Notes (Signed)
BEHAVIORAL HEALTH ROUNDING Patient sleeping: No. Patient alert and oriented: yes Behavior appropriate: Yes.  ; If no, describe:  Nutrition and fluids offered: yes Toileting and hygiene offered: Yes  Sitter present: q15 minute observations and security  monitoring Law enforcement present: Yes  ODS  

## 2016-08-12 NOTE — ED Notes (Signed)
Pt given lunch tray.

## 2016-08-12 NOTE — ED Notes (Signed)
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
## 2016-08-12 NOTE — ED Notes (Signed)
ED BHU PLACEMENT JUSTIFICATION Is the patient under IVC or is there intent for IVC: no  Is the patient medically cleared: Yes.   Is there vacancy in the ED BHU: Yes.   Is the population mix appropriate for patient: Yes.   Is the patient awaiting placement in inpatient or outpatient setting:   Has the patient had a psychiatric consult:  Consult pending  Survey of unit performed for contraband, proper placement and condition of furniture, tampering with fixtures in bathroom, shower, and each patient room: Yes.  ; Findings:  APPEARANCE/BEHAVIOR Calm and cooperative NEURO ASSESSMENT Orientation: oriented x4  Denies pain Hallucinations: No.None noted (Hallucinations) Speech: Normal Gait: normal RESPIRATORY ASSESSMENT Even  Unlabored respirations  CARDIOVASCULAR ASSESSMENT Pulses equal   regular rate  Skin warm and dry   GASTROINTESTINAL ASSESSMENT no GI complaint EXTREMITIES Full ROM  PLAN OF CARE Provide calm/safe environment. Vital signs assessed twice daily. ED BHU Assessment once each 12-hour shift. Collaborate with TTS daily or as condition indicates. Assure the ED provider has rounded once each shift. Provide and encourage hygiene. Provide redirection as needed. Assess for escalating behavior; address immediately and inform ED provider.  Assess family dynamic and appropriateness for visitation as needed: Yes.  ; If necessary, describe findings:  Educate the patient/family about BHU procedures/visitation: Yes.  ; If necessary, describe findings:

## 2016-08-12 NOTE — ED Notes (Signed)
Pt given breakfast tray and ambulated to bathroom unassisted.

## 2016-08-12 NOTE — ED Triage Notes (Signed)
Pt arrives by [employer], states he is SI. Reports ETOH tonight.

## 2016-08-12 NOTE — Discharge Instructions (Signed)
You have been seen in the emergency department for a  psychiatric concern. You have been evaluated both medically as well as psychiatrically. Please follow-up with your outpatient resources provided. Return to the emergency department for any worsening symptoms, or any thoughts of hurting yourself or anyone else so that we may attempt to help you. 

## 2016-08-12 NOTE — ED Notes (Signed)
SW in with pt

## 2016-08-12 NOTE — ED Notes (Signed)
No am meds ordered  He has been resting quietly this am  NAD assessed
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
## 2016-08-12 NOTE — ED Notes (Signed)
Pt given sprite 

## 2016-08-12 NOTE — ED Notes (Signed)
Pt ambulated to bathroom unassisted.

## 2016-08-18 ENCOUNTER — Encounter: Payer: Self-pay | Admitting: Emergency Medicine

## 2016-08-18 ENCOUNTER — Emergency Department
Admission: EM | Admit: 2016-08-18 | Discharge: 2016-08-18 | Disposition: A | Discharge status: Home / Self Care | Payer: Medicaid Other | Source: Home / Self Care | Attending: Emergency Medicine | Admitting: Emergency Medicine

## 2016-08-18 ENCOUNTER — Emergency Department
Admission: EM | Admit: 2016-08-18 | Discharge: 2016-08-18 | Disposition: A | Discharge status: Home / Self Care | Payer: Medicaid Other | Attending: Emergency Medicine | Admitting: Emergency Medicine

## 2016-08-18 DIAGNOSIS — F1010 Alcohol abuse, uncomplicated: Secondary | ICD-10-CM

## 2016-08-18 DIAGNOSIS — F10120 Alcohol abuse with intoxication, uncomplicated: Secondary | ICD-10-CM | POA: Insufficient documentation

## 2016-08-18 DIAGNOSIS — F17210 Nicotine dependence, cigarettes, uncomplicated: Secondary | ICD-10-CM

## 2016-08-18 DIAGNOSIS — K921 Melena: Secondary | ICD-10-CM | POA: Diagnosis present

## 2016-08-18 DIAGNOSIS — F1410 Cocaine abuse, uncomplicated: Secondary | ICD-10-CM | POA: Insufficient documentation

## 2016-08-18 DIAGNOSIS — Z79899 Other long term (current) drug therapy: Secondary | ICD-10-CM | POA: Insufficient documentation

## 2016-08-18 DIAGNOSIS — F1210 Cannabis abuse, uncomplicated: Secondary | ICD-10-CM | POA: Insufficient documentation

## 2016-08-18 DIAGNOSIS — K5900 Constipation, unspecified: Secondary | ICD-10-CM

## 2016-08-18 DIAGNOSIS — K649 Unspecified hemorrhoids: Secondary | ICD-10-CM | POA: Diagnosis not present

## 2016-08-18 DIAGNOSIS — F329 Major depressive disorder, single episode, unspecified: Secondary | ICD-10-CM | POA: Insufficient documentation

## 2016-08-18 DIAGNOSIS — F32A Depression, unspecified: Secondary | ICD-10-CM

## 2016-08-18 DIAGNOSIS — F10920 Alcohol use, unspecified with intoxication, uncomplicated: Secondary | ICD-10-CM

## 2016-08-18 LAB — COMPREHENSIVE METABOLIC PANEL
ALK PHOS: 46 U/L (ref 38–126)
ALT: 17 U/L (ref 17–63)
ANION GAP: 8 (ref 5–15)
AST: 31 U/L (ref 15–41)
Albumin: 4.6 g/dL (ref 3.5–5.0)
BUN: 12 mg/dL (ref 6–20)
CALCIUM: 9.3 mg/dL (ref 8.9–10.3)
CO2: 29 mmol/L (ref 22–32)
Chloride: 109 mmol/L (ref 101–111)
Creatinine, Ser: 1.06 mg/dL (ref 0.61–1.24)
GFR calc non Af Amer: >60 mL/min (ref >60)
Glucose, Bld: 86 mg/dL (ref 65–99)
POTASSIUM: 4.4 mmol/L (ref 3.5–5.1)
SODIUM: 146 mmol/L — AB (ref 135–145)
TOTAL PROTEIN: 7.7 g/dL (ref 6.5–8.1)
Total Bilirubin: 0.9 mg/dL (ref 0.3–1.2)

## 2016-08-18 LAB — CBC
HCT: 40.3 % (ref 40.0–52.0)
HEMOGLOBIN: 13.8 g/dL (ref 13.0–18.0)
MCH: 32.9 pg (ref 26.0–34.0)
MCHC: 34.3 g/dL (ref 32.0–36.0)
MCV: 95.8 fL (ref 80.0–100.0)
Platelets: 274 10*3/uL (ref 150–440)
RBC: 4.21 MIL/uL — ABNORMAL LOW (ref 4.40–5.90)
RDW: 13 % (ref 11.5–14.5)
WBC: 5.9 10*3/uL (ref 3.8–10.6)

## 2016-08-18 LAB — URINE DRUG SCREEN, QUALITATIVE (ARMC ONLY)
Amphetamines, Ur Screen: NOT DETECTED
BARBITURATES, UR SCREEN: NOT DETECTED
BENZODIAZEPINE, UR SCRN: NOT DETECTED
Cannabinoid 50 Ng, Ur ~~LOC~~: POSITIVE — AB
Cocaine Metabolite,Ur ~~LOC~~: POSITIVE — AB
MDMA (Ecstasy)Ur Screen: NOT DETECTED
METHADONE SCREEN, URINE: NOT DETECTED
Opiate, Ur Screen: NOT DETECTED
Phencyclidine (PCP) Ur S: NOT DETECTED
TRICYCLIC, UR SCREEN: NOT DETECTED

## 2016-08-18 LAB — SALICYLATE LEVEL

## 2016-08-18 LAB — ACETAMINOPHEN LEVEL

## 2016-08-18 LAB — ETHANOL: Alcohol, Ethyl (B): 196 mg/dL — ABNORMAL HIGH (ref <5)

## 2016-08-18 MED ORDER — SENNOSIDES-DOCUSATE SODIUM 8.6-50 MG PO TABS: 2.0000 | ORAL_TABLET | Freq: Every day | ORAL | 0 refills | Status: DC | PRN

## 2016-08-18 NOTE — ED Notes (Signed)
Ginger ale was given to patient 

## 2016-08-18 NOTE — ED Notes (Signed)
BEHAVIORAL HEALTH ROUNDING Patient sleeping: No. Patient alert and oriented: yes Behavior appropriate: Yes.  ; If no, describe:  Nutrition and fluids offered: yes Toileting and hygiene offered: Yes  Sitter present: q15 minute observations and security  monitoring Law enforcement present: Yes  ODS  

## 2016-08-18 NOTE — ED Notes (Signed)
Patient in bathroom

## 2016-08-18 NOTE — ED Notes (Signed)
Pt. States having rectal pain and discomfort..he says he had a bowel movement yesterday that had streaks of blood in it.  He reported that he was in detox last night and he figured he might as well get it checked out while he was here.  Pt states he has been here several times for this and it isn't getting better.

## 2016-08-18 NOTE — ED Notes (Signed)
Lunch was given to patient 

## 2016-08-18 NOTE — ED Triage Notes (Signed)
Pt just discharged this am for detox eval. Pt returns stating that he "forgot" to mention that he had some blood in his stool yesterday and also has rectal pain.

## 2016-08-18 NOTE — Discharge Instructions (Signed)
You have been seen in the Emergency Department (ED)  today for a psychiatric complaint.  You have been evaluated by psychiatry and we believe you are safe to be discharged from the hospital.   ° °Please return to the Emergency Department (ED)  immediately if you have ANY thoughts of hurting yourself or anyone else, so that we may help you. ° °Please avoid alcohol and drug use. ° °Follow up with your doctor and/or therapist as soon as possible regarding today's ED  visit.  ° °You may call crisis hotline for [place] at [phone]. ° °
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
## 2016-08-18 NOTE — ED Notes (Signed)
Patient observed lying in bed with eyes closed  Even, unlabored respirations observed   NAD pt appears to be sleeping  I will continue to monitor along with every 15 minute visual observations and ongoing security monitoring    
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
## 2016-08-18 NOTE — ED Notes (Signed)

## 2016-08-18 NOTE — ED Notes (Signed)
Breakfast was placed in room patient sleeping will warm up later

## 2016-08-18 NOTE — ED Notes (Signed)
ED BHU PLACEMENT JUSTIFICATION Is the patient under IVC or is there intent for IVC: no Is the patient medically cleared: Yes.   Is there vacancy in the ED BHU: Yes.   Is the population mix appropriate for patient: Yes.   Is the patient awaiting placement in inpatient or outpatient setting:  Has the patient had a psychiatric consult: consult pending  Survey of unit performed for contraband, proper placement and condition of furniture, tampering with fixtures in bathroom, shower, and each patient room: Yes.  ; Findings:  APPEARANCE/BEHAVIOR Calm and cooperative NEURO ASSESSMENT Orientation: oriented x4  Denies pain Hallucinations: No.None noted (Hallucinations) Speech: Normal Gait: normal RESPIRATORY ASSESSMENT Even  Unlabored respirations  CARDIOVASCULAR ASSESSMENT Pulses equal   regular rate  Skin warm and dry   GASTROINTESTINAL ASSESSMENT no GI complaint EXTREMITIES Full ROM  PLAN OF CARE Provide calm/safe environment. Vital signs assessed twice daily. ED BHU Assessment once each 12-hour shift. Collaborate with TTS daily or as condition indicates. Assure the ED provider has rounded once each shift. Provide and encourage hygiene. Provide redirection as needed. Assess for escalating behavior; address immediately and inform ED provider.  Assess family dynamic and appropriateness for visitation as needed: Yes.  ; If necessary, describe findings:  Educate the patient/family about BHU procedures/visitation: Yes.  ; If necessary, describe findings:

## 2016-08-18 NOTE — ED Notes (Signed)
Pt signed in disposition for discharge. E-signature not working

## 2016-08-18 NOTE — ED Notes (Signed)
Patient in shower 

## 2016-08-18 NOTE — ED Notes (Signed)
Dr.clapaac in room with patient 

## 2016-08-18 NOTE — ED Notes (Signed)
No am meds ordered at this time   assessment completed  Psych consult pending  Continue to monitor
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
## 2016-08-18 NOTE — ED Triage Notes (Addendum)
Pt arrives to ED by [employer], voluntary, pt having SI thoughts and asking for detox from "drugs, alcohol and life," per pt. Unable to understand pt at this time of triage, pts with word salad. Pt yelling, removes BP cuff and pulse oximeter and throws in the floor. [employer] x2 in triage. Pt taken to RM for change out and protocols. Pt denies plan for SI but sts "I just don't care anymore and I don't want to hurt nobody but I don't know if I would or not." [employer] reports: pt was at females house, [employer] called out for pt trespassing, pt sts he has been staying with male since last year. On arrival, per [employer], pt asked to come to ED for detox and SI thoughts.

## 2016-08-18 NOTE — ED Notes (Addendum)
Breakfast was warm and given to patient

## 2016-08-18 NOTE — ED Notes (Signed)
Fatima in room talking with patient at this time

## 2016-08-18 NOTE — ED Provider Notes (Signed)
-----------------------------------------   1:30 PM on 08/18/2016 -----------------------------------------   Blood pressure (!) 158/100, pulse 76, temperature 98.1 F (36.7 C), temperature source Oral, resp. rate 17, weight 160 lb (72.6 kg), SpO2 98 %.  Patient has been evaluated by psychiatry and cleared for discharge. IVC lifted by Dr. Toni Amend. Patient's labs have been reviewed with no acute findings. Patient will be discharged at this time to home    [place], MD 08/18/16 1330

## 2016-08-18 NOTE — Discharge Instructions (Signed)
To prevent constipation, eat a high-fiber diet, stay active, and drink plenty of water.  You may take a stool softener as well.  Make an appointment with your primary care physician.  Return to the emergency department if you develop severe pain, bleeding, lightheadedness or fainting, fever, or any other symptoms concerning to you.

## 2016-08-18 NOTE — BH Assessment (Signed)
Pt psychiatrically cleared per Dr.Clapacs. 
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
## 2016-09-02 ENCOUNTER — Encounter: Payer: Self-pay | Admitting: *Deleted

## 2016-09-02 ENCOUNTER — Emergency Department
Admission: EM | Admit: 2016-09-02 | Discharge: 2016-09-02 | Disposition: A | Discharge status: Home / Self Care | Payer: Medicaid Other | Attending: Emergency Medicine | Admitting: Emergency Medicine

## 2016-09-02 DIAGNOSIS — F1410 Cocaine abuse, uncomplicated: Secondary | ICD-10-CM | POA: Diagnosis not present

## 2016-09-02 DIAGNOSIS — F17210 Nicotine dependence, cigarettes, uncomplicated: Secondary | ICD-10-CM | POA: Insufficient documentation

## 2016-09-02 DIAGNOSIS — F1910 Other psychoactive substance abuse, uncomplicated: Secondary | ICD-10-CM

## 2016-09-02 DIAGNOSIS — F1010 Alcohol abuse, uncomplicated: Secondary | ICD-10-CM | POA: Insufficient documentation

## 2016-09-02 DIAGNOSIS — Z79899 Other long term (current) drug therapy: Secondary | ICD-10-CM | POA: Insufficient documentation

## 2016-09-02 NOTE — ED Triage Notes (Signed)
States he is seeking detox from [facility] and crack, last used last night, denies any pain, denies any SI, awake and alert

## 2016-09-02 NOTE — BH Assessment (Signed)
Per Eber Jonesarolyn, Armed forces technical officerclinical director pt is denied @ [facility] due to previous behaviors while at facility. Clinician will attempt [facility] Referral. Amy, RN informed.  Per Dr.Clapacs pt is now requesting d/c. Per Dr.Goodman pt will be d/c.
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
## 2016-09-02 NOTE — BH Assessment (Signed)
Pt detox referral submitted to [facility].

## 2016-09-02 NOTE — ED Notes (Signed)

## 2016-09-02 NOTE — ED Notes (Signed)
BEHAVIORAL HEALTH ROUNDING Patient sleeping: Yes.   Patient alert and oriented: eyes closed  Appears to be asleep Behavior appropriate: Yes.  ; If no, describe:  Nutrition and fluids offered: Yes  Toileting and hygiene offered: sleeping Sitter present: q 15 minute observations and security monitoring Law enforcement present: yes  ODS 

## 2016-09-02 NOTE — ED Notes (Signed)
BEHAVIORAL HEALTH ROUNDING Patient sleeping: No. Patient alert and oriented: yes Behavior appropriate: Yes.  ; If no, describe:  Nutrition and fluids offered: yes Toileting and hygiene offered: Yes  Sitter present: q15 minute observations and security  monitoring Law enforcement present: Yes  ODS  

## 2016-09-02 NOTE — ED Notes (Signed)
Pt given saltine crackers with peanut butter & diet coke.

## 2016-09-02 NOTE — Discharge Instructions (Signed)
Please seek medical attention and help for any thoughts about wanting to harm yourself, harm others, any concerning change in behavior, severe depression, inappropriate drug use or any other new or concerning symptoms. ° °

## 2016-09-02 NOTE — BH Assessment (Signed)
Per EDP staff (Amy, RN) and Dr.Veronese pt does not require TTS Consult. Pt requesting detox services (ETOH/ Crack). Clinician spoke with pt who reports daily ETOH use and crack use "not everyday". Pt reports drinking one 40oz beer pta and using 1 gram of crack on last night. PT continues to deny HI/SI. Pt denies hallucinations. Pt is oriented and does not appear to be responding to internal stimuli. Clinician confirmed male detox bed availiability with [facility] and will facilitate referral. Clinician discussed [facility] referral with pt and he agrees with plan.

## 2016-09-07 ENCOUNTER — Encounter: Payer: Self-pay | Admitting: Emergency Medicine

## 2016-09-07 ENCOUNTER — Emergency Department
Admission: EM | Admit: 2016-09-07 | Discharge: 2016-09-07 | Disposition: A | Discharge status: Home / Self Care | Payer: Medicaid Other | Source: Home / Self Care | Attending: Emergency Medicine | Admitting: Emergency Medicine

## 2016-09-07 ENCOUNTER — Emergency Department
Admission: EM | Admit: 2016-09-07 | Discharge: 2016-09-07 | Disposition: A | Discharge status: Home / Self Care | Payer: Medicaid Other | Attending: Emergency Medicine | Admitting: Emergency Medicine

## 2016-09-07 DIAGNOSIS — F1290 Cannabis use, unspecified, uncomplicated: Secondary | ICD-10-CM | POA: Diagnosis not present

## 2016-09-07 DIAGNOSIS — F10120 Alcohol abuse with intoxication, uncomplicated: Secondary | ICD-10-CM | POA: Insufficient documentation

## 2016-09-07 DIAGNOSIS — F17210 Nicotine dependence, cigarettes, uncomplicated: Secondary | ICD-10-CM | POA: Insufficient documentation

## 2016-09-07 DIAGNOSIS — Z79899 Other long term (current) drug therapy: Secondary | ICD-10-CM | POA: Insufficient documentation

## 2016-09-07 DIAGNOSIS — R51 Headache: Principal | ICD-10-CM

## 2016-09-07 DIAGNOSIS — F1490 Cocaine use, unspecified, uncomplicated: Secondary | ICD-10-CM | POA: Diagnosis not present

## 2016-09-07 DIAGNOSIS — R519 Headache, unspecified: Secondary | ICD-10-CM

## 2016-09-07 DIAGNOSIS — F19920 Other psychoactive substance use, unspecified with intoxication, uncomplicated: Secondary | ICD-10-CM

## 2016-09-07 MED ORDER — ACETAMINOPHEN 500 MG PO TABS
1000.0000 mg | ORAL_TABLET | Freq: Once | ORAL | Status: AC
  Administered 2016-09-07: 1000 mg via ORAL

## 2016-09-07 MED ORDER — ACETAMINOPHEN 500 MG PO TABS
ORAL_TABLET | ORAL | Status: AC
  Administered 2016-09-07: 1000 mg via ORAL
  Filled 2016-09-07: qty 2

## 2016-09-07 MED ORDER — IBUPROFEN 600 MG PO TABS
ORAL_TABLET | ORAL | Status: AC
  Administered 2016-09-07: 600 mg via ORAL
  Filled 2016-09-07: qty 1

## 2016-09-07 MED ORDER — IBUPROFEN 600 MG PO TABS
600.0000 mg | ORAL_TABLET | Freq: Once | ORAL | Status: AC
  Administered 2016-09-07: 600 mg via ORAL

## 2016-09-07 NOTE — ED Notes (Addendum)
Pt brought back to ED by [employer] officer Tomasa BlaseSchultz who is asking to have pt medically cleared to be taken to jail. Pt taken to Rm 4.

## 2016-09-07 NOTE — ED Triage Notes (Signed)
Patient brought in by ems. Patient reported to ems that he is having thoughts of hurting himself. Patient told ems that he has been drinking ETOH and using cocaine tonight.

## 2016-09-07 NOTE — ED Triage Notes (Signed)
Patient brought in by [employer] for medical clearance for jail.
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
## 2016-09-07 NOTE — ED Notes (Addendum)
Pt approached by this RN for chief complaint; pt with labile and erratic behavior; throwing equipment in triage room one and throwing trash at [employer], [employer]; when this RN asked what the ED could help, pt replied with "fuck y'all I don't need no help" pt advised of rights to receive treatment at this facility pt repeated offensive remarks and declined help.  Pt invited by [employer] and [employer] [place] to return when ready for help.

## 2016-09-07 NOTE — Discharge Instructions (Signed)
THE PATIENT IS MEDICALLY STABLE FOR BOOKING  Please follow-up with your primary care physician as needed once you are released from custody. Return to the emergency department sooner for any new or worsening symptoms.

## 2016-09-24 ENCOUNTER — Emergency Department: Payer: Medicaid Other

## 2016-09-24 ENCOUNTER — Emergency Department
Admission: EM | Admit: 2016-09-24 | Discharge: 2016-09-24 | Disposition: A | Discharge status: Home / Self Care | Payer: Medicaid Other | Attending: Emergency Medicine | Admitting: Emergency Medicine

## 2016-09-24 DIAGNOSIS — Y929 Unspecified place or not applicable: Secondary | ICD-10-CM | POA: Diagnosis not present

## 2016-09-24 DIAGNOSIS — S0003XA Contusion of scalp, initial encounter: Secondary | ICD-10-CM | POA: Insufficient documentation

## 2016-09-24 DIAGNOSIS — Z79899 Other long term (current) drug therapy: Secondary | ICD-10-CM | POA: Diagnosis not present

## 2016-09-24 DIAGNOSIS — F17210 Nicotine dependence, cigarettes, uncomplicated: Secondary | ICD-10-CM | POA: Insufficient documentation

## 2016-09-24 DIAGNOSIS — S0990XA Unspecified injury of head, initial encounter: Secondary | ICD-10-CM | POA: Diagnosis present

## 2016-09-24 DIAGNOSIS — Y9389 Activity, other specified: Secondary | ICD-10-CM | POA: Insufficient documentation

## 2016-09-24 DIAGNOSIS — Y999 Unspecified external cause status: Secondary | ICD-10-CM | POA: Diagnosis not present

## 2016-09-24 DIAGNOSIS — F10129 Alcohol abuse with intoxication, unspecified: Secondary | ICD-10-CM | POA: Diagnosis not present

## 2016-09-24 DIAGNOSIS — F10920 Alcohol use, unspecified with intoxication, uncomplicated: Secondary | ICD-10-CM

## 2016-09-24 LAB — COMPREHENSIVE METABOLIC PANEL
ALT: 18 U/L (ref 17–63)
AST: 30 U/L (ref 15–41)
Albumin: 4.4 g/dL (ref 3.5–5.0)
Alkaline Phosphatase: 58 U/L (ref 38–126)
Anion gap: 11 (ref 5–15)
BUN: 9 mg/dL (ref 6–20)
CHLORIDE: 105 mmol/L (ref 101–111)
CO2: 25 mmol/L (ref 22–32)
CREATININE: 0.83 mg/dL (ref 0.61–1.24)
Calcium: 8.7 mg/dL — ABNORMAL LOW (ref 8.9–10.3)
GFR calc non Af Amer: >60 mL/min (ref >60)
Glucose, Bld: 82 mg/dL (ref 65–99)
Potassium: 4.4 mmol/L (ref 3.5–5.1)
SODIUM: 141 mmol/L (ref 135–145)
Total Bilirubin: 0.6 mg/dL (ref 0.3–1.2)
Total Protein: 7.9 g/dL (ref 6.5–8.1)

## 2016-09-24 LAB — CBC WITH DIFFERENTIAL/PLATELET
BASOS ABS: 0 10*3/uL (ref 0–0.1)
BASOS PCT: 1 %
EOS ABS: 0.2 10*3/uL (ref 0–0.7)
EOS PCT: 5 %
HCT: 39 % — ABNORMAL LOW (ref 40.0–52.0)
Hemoglobin: 13.3 g/dL (ref 13.0–18.0)
Lymphocytes Relative: 38 %
Lymphs Abs: 1.8 10*3/uL (ref 1.0–3.6)
MCH: 32.7 pg (ref 26.0–34.0)
MCHC: 34.2 g/dL (ref 32.0–36.0)
MCV: 95.8 fL (ref 80.0–100.0)
Monocytes Absolute: 0.4 10*3/uL (ref 0.2–1.0)
Monocytes Relative: 9 %
Neutro Abs: 2.2 10*3/uL (ref 1.4–6.5)
Neutrophils Relative %: 47 %
PLATELETS: 303 10*3/uL (ref 150–440)
RBC: 4.07 MIL/uL — AB (ref 4.40–5.90)
RDW: 12.8 % (ref 11.5–14.5)
WBC: 4.8 10*3/uL (ref 3.8–10.6)

## 2016-09-24 LAB — ETHANOL: Alcohol, Ethyl (B): 113 mg/dL — ABNORMAL HIGH (ref <5)

## 2016-09-24 MED ORDER — SODIUM CHLORIDE 0.9 % IV BOLUS (SEPSIS)
1000.0000 mL | Freq: Once | INTRAVENOUS | Status: AC
  Administered 2016-09-24: 1000 mL via INTRAVENOUS

## 2016-09-24 NOTE — Discharge Instructions (Signed)
1. Drink alcohol only in moderation. 2. Apply ice several times daily as needed to reduce swelling. 3. Return to the ER for worsening symptoms, persistent vomiting, lethargy or other concerns.

## 2016-09-24 NOTE — ED Notes (Signed)
Report to amber, rn.  
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
## 2016-09-24 NOTE — ED Notes (Signed)
Patient transported to CT 

## 2016-09-24 NOTE — ED Provider Notes (Signed)
Patient clinically sober and ready for discharge.    Jene EveryKinner, Genevie Elman, MD 09/24/16 [phone]

## 2016-09-24 NOTE — ED Notes (Signed)
Pt sleeping, covered with warm blanket.
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
## 2016-10-02 ENCOUNTER — Emergency Department
Admission: EM | Admit: 2016-10-02 | Discharge: 2016-10-03 | Disposition: A | Discharge status: Home / Self Care | Payer: Medicaid Other | Attending: Emergency Medicine | Admitting: Emergency Medicine

## 2016-10-02 DIAGNOSIS — F17210 Nicotine dependence, cigarettes, uncomplicated: Secondary | ICD-10-CM | POA: Insufficient documentation

## 2016-10-02 DIAGNOSIS — F1910 Other psychoactive substance abuse, uncomplicated: Secondary | ICD-10-CM | POA: Diagnosis not present

## 2016-10-02 DIAGNOSIS — R45851 Suicidal ideations: Secondary | ICD-10-CM | POA: Insufficient documentation

## 2016-10-02 LAB — COMPREHENSIVE METABOLIC PANEL
ALBUMIN: 4.7 g/dL (ref 3.5–5.0)
ALK PHOS: 56 U/L (ref 38–126)
ALT: 17 U/L (ref 17–63)
AST: 32 U/L (ref 15–41)
Anion gap: 16 — ABNORMAL HIGH (ref 5–15)
BUN: 18 mg/dL (ref 6–20)
CALCIUM: 9.4 mg/dL (ref 8.9–10.3)
CO2: 20 mmol/L — ABNORMAL LOW (ref 22–32)
CREATININE: 0.91 mg/dL (ref 0.61–1.24)
Chloride: 101 mmol/L (ref 101–111)
GFR calc Af Amer: >60 mL/min (ref >60)
GFR calc non Af Amer: >60 mL/min (ref >60)
GLUCOSE: 79 mg/dL (ref 65–99)
Potassium: 3.7 mmol/L (ref 3.5–5.1)
SODIUM: 137 mmol/L (ref 135–145)
Total Bilirubin: 0.8 mg/dL (ref 0.3–1.2)
Total Protein: 8.4 g/dL — ABNORMAL HIGH (ref 6.5–8.1)

## 2016-10-02 LAB — URINE DRUG SCREEN, QUALITATIVE (ARMC ONLY)
Amphetamines, Ur Screen: NOT DETECTED
Barbiturates, Ur Screen: NOT DETECTED
Benzodiazepine, Ur Scrn: NOT DETECTED
CANNABINOID 50 NG, UR ~~LOC~~: POSITIVE — AB
Cocaine Metabolite,Ur ~~LOC~~: NOT DETECTED
MDMA (ECSTASY) UR SCREEN: NOT DETECTED
Methadone Scn, Ur: NOT DETECTED
Opiate, Ur Screen: NOT DETECTED
PHENCYCLIDINE (PCP) UR S: NOT DETECTED
Tricyclic, Ur Screen: NOT DETECTED

## 2016-10-02 LAB — CBC
HCT: 39.6 % — ABNORMAL LOW (ref 40.0–52.0)
Hemoglobin: 13.6 g/dL (ref 13.0–18.0)
MCH: 33 pg (ref 26.0–34.0)
MCHC: 34.3 g/dL (ref 32.0–36.0)
MCV: 96.2 fL (ref 80.0–100.0)
PLATELETS: 242 10*3/uL (ref 150–440)
RBC: 4.11 MIL/uL — AB (ref 4.40–5.90)
RDW: 13 % (ref 11.5–14.5)
WBC: 5.8 10*3/uL (ref 3.8–10.6)

## 2016-10-02 LAB — ETHANOL: Alcohol, Ethyl (B): 191 mg/dL — ABNORMAL HIGH (ref <5)

## 2016-10-02 NOTE — ED Triage Notes (Signed)
Pt presents to ED via [facility] voluntarily for SI. Pt reports SI plan includes "an overdose on my medications". Pt is currently calm and cooperative but has a history of aggression.
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
## 2016-10-03 MED ORDER — OLANZAPINE 2.5 MG PO TABS: 2.5000 mg | ORAL_TABLET | Freq: Every day | ORAL | Status: DC

## 2016-10-03 NOTE — ED Notes (Signed)
[facility]

## 2016-10-03 NOTE — ED Provider Notes (Signed)
-----------------------------------------   1:15 AM on 10/03/2016 -----------------------------------------   Blood pressure 123/88, pulse 84, temperature 98.5 F (36.9 C), temperature source Oral, resp. rate 18, height 5\' 9"  (1.753 m), weight 71.2 kg (157 lb), SpO2 96 %.  Patient is calm and resting at present. Patella site has recommended involuntary commitment and psychiatric admission, which I have completed petition for. In addition have written for withdrawal protocol and Zyprexa as recommended by psychiatry.     Sharyn CreamerQuale, Mark, MD 10/03/16 [phone]
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
## 2016-10-03 NOTE — ED Notes (Signed)
Pt was discharged per MD order to lobby after receiving AVS, [facility] information/referral, and AA information. All belongings were returned. He verbalized readiness for discharge. Pt was urged to return if he felt unsafe, and pt verbalized understanding. Pt appeared in no acute distress when escorted to lobby by this Clinical research associatewriter.

## 2016-10-03 NOTE — ED Provider Notes (Signed)
-----------------------------------------   12:02 PM on 10/03/2016 -----------------------------------------   Blood pressure 109/86, pulse 65, temperature 98.3 F (36.8 C), temperature source Oral, resp. rate 16, height 5\' 9"  (1.753 m), weight 71.2 kg (157 lb), SpO2 98 %.  The patient had no acute events since last update.  Calm and cooperative at this time.  Patient was violated by the specialist on-call psychiatrist, Dr. Maricela BoSprague, who says that the patient may be discharged to home and will be reversing his IVC at this time.  Patient at this time is not clinically intoxicated. He is calm and cooperative. Denies all suicidal and homicidal ideation. Does not report any hallucinations. He is aware the plan to follow-up as an outpatient for alcohol counseling. He is understanding of this plan and willing to comply. No tremulousness or signs of withdrawal at this time.    Myrna BlazerSchaevitz, Devin Matthew, MD 10/03/16 [phone]

## 2017-02-16 ENCOUNTER — Emergency Department
Admission: EM | Admit: 2017-02-16 | Discharge: 2017-02-17 | Disposition: A | Discharge status: Home / Self Care | Payer: Medicaid Other | Attending: Emergency Medicine | Admitting: Emergency Medicine

## 2017-02-16 DIAGNOSIS — R45851 Suicidal ideations: Secondary | ICD-10-CM | POA: Insufficient documentation

## 2017-02-16 DIAGNOSIS — F1020 Alcohol dependence, uncomplicated: Secondary | ICD-10-CM | POA: Diagnosis not present

## 2017-02-16 DIAGNOSIS — R45850 Homicidal ideations: Secondary | ICD-10-CM | POA: Insufficient documentation

## 2017-02-16 DIAGNOSIS — F1220 Cannabis dependence, uncomplicated: Secondary | ICD-10-CM | POA: Insufficient documentation

## 2017-02-16 DIAGNOSIS — R451 Restlessness and agitation: Secondary | ICD-10-CM | POA: Diagnosis present

## 2017-02-16 DIAGNOSIS — F17210 Nicotine dependence, cigarettes, uncomplicated: Secondary | ICD-10-CM | POA: Insufficient documentation

## 2017-02-16 DIAGNOSIS — F1994 Other psychoactive substance use, unspecified with psychoactive substance-induced mood disorder: Secondary | ICD-10-CM

## 2017-02-16 DIAGNOSIS — F1010 Alcohol abuse, uncomplicated: Secondary | ICD-10-CM

## 2017-02-16 LAB — COMPREHENSIVE METABOLIC PANEL
ALBUMIN: 3.8 g/dL (ref 3.5–5.0)
ALT: 15 U/L — AB (ref 17–63)
AST: 28 U/L (ref 15–41)
Alkaline Phosphatase: 43 U/L (ref 38–126)
Anion gap: 14 (ref 5–15)
BUN: 8 mg/dL (ref 6–20)
CO2: 24 mmol/L (ref 22–32)
CREATININE: 0.88 mg/dL (ref 0.61–1.24)
Calcium: 9 mg/dL (ref 8.9–10.3)
Chloride: 104 mmol/L (ref 101–111)
GFR calc Af Amer: >60 mL/min (ref >60)
GFR calc non Af Amer: >60 mL/min (ref >60)
GLUCOSE: 84 mg/dL (ref 65–99)
POTASSIUM: 3.2 mmol/L — AB (ref 3.5–5.1)
Sodium: 142 mmol/L (ref 135–145)
TOTAL PROTEIN: 7.2 g/dL (ref 6.5–8.1)
Total Bilirubin: 0.8 mg/dL (ref 0.3–1.2)

## 2017-02-16 LAB — URINE DRUG SCREEN, QUALITATIVE (ARMC ONLY)
Amphetamines, Ur Screen: NOT DETECTED
BARBITURATES, UR SCREEN: NOT DETECTED
Benzodiazepine, Ur Scrn: NOT DETECTED
CANNABINOID 50 NG, UR ~~LOC~~: POSITIVE — AB
COCAINE METABOLITE, UR ~~LOC~~: NOT DETECTED
MDMA (ECSTASY) UR SCREEN: NOT DETECTED
Methadone Scn, Ur: NOT DETECTED
Opiate, Ur Screen: NOT DETECTED
PHENCYCLIDINE (PCP) UR S: NOT DETECTED
TRICYCLIC, UR SCREEN: NOT DETECTED

## 2017-02-16 LAB — CBC
HCT: 38 % — ABNORMAL LOW (ref 40.0–52.0)
Hemoglobin: 12.9 g/dL — ABNORMAL LOW (ref 13.0–18.0)
MCH: 33.5 pg (ref 26.0–34.0)
MCHC: 34.1 g/dL (ref 32.0–36.0)
MCV: 98.4 fL (ref 80.0–100.0)
PLATELETS: 233 10*3/uL (ref 150–440)
RBC: 3.86 MIL/uL — AB (ref 4.40–5.90)
RDW: 12.1 % (ref 11.5–14.5)
WBC: 4.2 10*3/uL (ref 3.8–10.6)

## 2017-02-16 LAB — SALICYLATE LEVEL: Salicylate Lvl: <7 mg/dL (ref 2.8–30.0)

## 2017-02-16 LAB — ETHANOL: ALCOHOL ETHYL (B): 239 mg/dL — AB (ref <10)

## 2017-02-16 LAB — ACETAMINOPHEN LEVEL: Acetaminophen (Tylenol), Serum: <10 ug/mL — ABNORMAL LOW (ref 10–30)

## 2017-02-16 MED ORDER — HALOPERIDOL LACTATE 5 MG/ML IJ SOLN
5.0000 mg | Freq: Once | INTRAMUSCULAR | Status: AC
  Administered 2017-02-16: 5 mg via INTRAMUSCULAR

## 2017-02-16 MED ORDER — LORAZEPAM 2 MG/ML IJ SOLN
2.0000 mg | Freq: Once | INTRAMUSCULAR | Status: AC
  Administered 2017-02-16: 2 mg via INTRAMUSCULAR

## 2017-02-16 MED ORDER — DIPHENHYDRAMINE HCL 50 MG/ML IJ SOLN
25.0000 mg | Freq: Once | INTRAMUSCULAR | Status: AC
  Administered 2017-02-16: 25 mg via INTRAVENOUS

## 2017-02-16 NOTE — ED Notes (Addendum)
Pt belongings 2 jackets, white button down, navy blue t shirt, green ninja turtle pj pants, black belt, red belt, white tennis shoes, white socks.
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
## 2017-02-16 NOTE — ED Notes (Signed)
Pt changed into purple scrubs by Butch RN. This RN took blood from R hand. VS taken at this time as well. Pt sleepy, arousable to stimulus. Falls back asleep quickly.

## 2017-02-16 NOTE — ED Triage Notes (Signed)
Pt arrives to ED via [employer] in handcuffs. PT ambulatory with [employer] from car. Pt yelling profanity. Pt refusing to cooperative with triage. Will no answer questions. Making moves toward staff while remaining in handcuffs. [employer] reports that pt has been drinking today. [employer] reports that pt called 911 himself stating SI. Dr. Sharma CovertNorman at bedside upon arrival with RN's and officers.

## 2017-02-16 NOTE — ED Notes (Signed)
Pt ambulatory with staff to restroom to urinate.

## 2017-02-16 NOTE — ED Notes (Signed)
Blue knife given to [facility] to lock up.

## 2017-02-16 NOTE — ED Notes (Signed)
Previously blue pocket knife had been given to [facility] to lock up. [facility] gave this RN a yellow paper with a key taped on stating that knife had been locked up. Yellow paper with key taped to it placed on pt chart.

## 2017-02-16 NOTE — ED Notes (Signed)
Pt is resting on stretcher at this time.

## 2017-02-16 NOTE — ED Notes (Signed)
[employer] has removed handcuff from R wrist. Circulation is good. Pulses present. Hand warm and dry. Will continue to monitor pt.

## 2017-02-17 DIAGNOSIS — F1994 Other psychoactive substance use, unspecified with psychoactive substance-induced mood disorder: Secondary | ICD-10-CM | POA: Diagnosis not present

## 2017-02-17 MED ORDER — TRAZODONE HCL 100 MG PO TABS: 100.0000 mg | ORAL_TABLET | Freq: Every day | ORAL | 0 refills | Status: AC

## 2017-02-17 NOTE — Discharge Instructions (Signed)
You have been seen in the emergency department for a  psychiatric concern. You have been evaluated both medically as well as psychiatrically. Please follow-up with your outpatient resources provided. Return to the emergency department for any worsening symptoms, or any thoughts of hurting yourself or anyone else so that we may attempt to help you. 

## 2017-02-17 NOTE — BH Assessment (Signed)
Pt required chemical sedation for agitation and aggression.  TTS will assess once patient is coherent.

## 2017-02-17 NOTE — ED Notes (Signed)
[facility]. Patient care assumed. Patient/RN introduction complete. Pt resting comfortably at this time, awaiting psych eval, will continue to monitor.
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
## 2017-02-17 NOTE — ED Notes (Signed)

## 2017-02-17 NOTE — ED Notes (Signed)
Patient observed lying in bed with eyes closed  Even, unlabored respirations observed   NAD pt appears to be sleeping  I will continue to monitor along with every 15 minute visual observations and ongoing security monitoring    

## 2017-02-17 NOTE — ED Provider Notes (Deleted)
icing. The patient 11:00 PM from Dr. Sharma CovertNorman. Patient evaluated by Dr. Maricela BoSprague who reversed the commitment and recommended discharge. Patient diagnosed with ADHD   Darci CurrentBrown, [place] N, MD 02/17/17 [phone]

## 2017-02-17 NOTE — ED Notes (Signed)
BEHAVIORAL HEALTH ROUNDING Patient sleeping: No. Patient alert and oriented: yes Behavior appropriate: Yes.  ; If no, describe:  Nutrition and fluids offered: yes Toileting and hygiene offered: Yes  Sitter present: q15 minute observations and security  monitoring Law enforcement present: Yes  ODS  

## 2017-02-17 NOTE — ED Provider Notes (Signed)
-----------------------------------------   3:33 PM on 02/17/2017 -----------------------------------------  Patient has been seen by pyschiatry and they believe the patient is safe for discharge home from a psychiatric standpoint.  Patient is medical workup has been largely nonrevealing besides an elevated alcohol level approximately 17 hours ago.  Patient will be discharged home with referral to [facility].   Minna AntisPaduchowski, Lopez Dentinger, MD 02/17/17 1534
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
## 2017-02-17 NOTE — ED Notes (Signed)
ED Is the patient under IVC or is there intent for IVC: Yes.   Is the patient medically cleared: Yes.   Is there vacancy in the ED BHU: Yes.   Is the population mix appropriate for patient: Yes.   Is the patient awaiting placement in inpatient or outpatient setting:  Has the patient had a psychiatric consult: consult pending Survey of unit performed for contraband, proper placement and condition of furniture, tampering with fixtures in bathroom, shower, and each patient room: Yes.  ; Findings:  APPEARANCE/BEHAVIOR Calm and cooperative NEURO ASSESSMENT Orientation: oriented x4   Hallucinations: No.None noted (Hallucinations) denies Speech: Normal Gait: normal RESPIRATORY ASSESSMENT Even  Unlabored respirations  CARDIOVASCULAR ASSESSMENT Pulses equal   regular rate  Skin warm and dry   GASTROINTESTINAL ASSESSMENT no GI complaint EXTREMITIES Full ROM  PLAN OF CARE Provide calm/safe environment. Vital signs assessed twice daily. ED BHU Assessment once each 12-hour shift. Collaborate with TTS when available or as condition indicates. Assure the ED provider has rounded once each shift. Provide and encourage hygiene. Provide redirection as needed. Assess for escalating behavior; address immediately and inform ED provider.  Assess family dynamic and appropriateness for visitation as needed: Yes.  ; If necessary, describe findings:  Educate the patient/family about BHU procedures/visitation: Yes.  ; If necessary, describe findings:

## 2017-02-17 NOTE — ED Notes (Signed)
BEHAVIORAL HEALTH ROUNDING Patient sleeping: Yes.   Patient alert and oriented: eyes closed  Appears to be asleep Behavior appropriate: Yes.  ; If no, describe:  Nutrition and fluids offered: Yes  Toileting and hygiene offered: sleeping Sitter present: q 15 minute observations and security monitoring Law enforcement present: yes  ODS 

## 2017-03-10 ENCOUNTER — Emergency Department
Admission: EM | Admit: 2017-03-10 | Discharge: 2017-03-11 | Disposition: A | Discharge status: Home / Self Care | Payer: Medicaid Other | Attending: Emergency Medicine | Admitting: Emergency Medicine

## 2017-03-10 DIAGNOSIS — Z79899 Other long term (current) drug therapy: Secondary | ICD-10-CM | POA: Insufficient documentation

## 2017-03-10 DIAGNOSIS — F10929 Alcohol use, unspecified with intoxication, unspecified: Secondary | ICD-10-CM | POA: Diagnosis not present

## 2017-03-10 DIAGNOSIS — F1220 Cannabis dependence, uncomplicated: Secondary | ICD-10-CM | POA: Diagnosis present

## 2017-03-10 DIAGNOSIS — F1994 Other psychoactive substance use, unspecified with psychoactive substance-induced mood disorder: Secondary | ICD-10-CM | POA: Diagnosis not present

## 2017-03-10 DIAGNOSIS — F1020 Alcohol dependence, uncomplicated: Secondary | ICD-10-CM | POA: Diagnosis present

## 2017-03-10 DIAGNOSIS — F17210 Nicotine dependence, cigarettes, uncomplicated: Secondary | ICD-10-CM | POA: Diagnosis not present

## 2017-03-10 DIAGNOSIS — F339 Major depressive disorder, recurrent, unspecified: Secondary | ICD-10-CM | POA: Diagnosis not present

## 2017-03-10 DIAGNOSIS — F329 Major depressive disorder, single episode, unspecified: Secondary | ICD-10-CM | POA: Diagnosis present

## 2017-03-10 DIAGNOSIS — R45851 Suicidal ideations: Secondary | ICD-10-CM | POA: Diagnosis not present

## 2017-03-10 MED ORDER — HALOPERIDOL LACTATE 5 MG/ML IJ SOLN: 5.0000 mg | Freq: Once | INTRAMUSCULAR | Status: DC

## 2017-03-10 MED ORDER — HALOPERIDOL LACTATE 5 MG/ML IJ SOLN
INTRAMUSCULAR | Status: AC
  Filled 2017-03-10: qty 1

## 2017-03-10 MED ORDER — LORAZEPAM 2 MG/ML IJ SOLN
INTRAMUSCULAR | Status: AC
  Filled 2017-03-10: qty 1

## 2017-03-10 MED ORDER — LORAZEPAM 1 MG PO TABS
ORAL_TABLET | ORAL | Status: AC
  Administered 2017-03-11: 2 mg
  Filled 2017-03-10: qty 2

## 2017-03-10 MED ORDER — DIPHENHYDRAMINE HCL 50 MG/ML IJ SOLN
INTRAMUSCULAR | Status: AC
  Filled 2017-03-10: qty 1

## 2017-03-10 MED ORDER — DIPHENHYDRAMINE HCL 50 MG/ML IJ SOLN
50.0000 mg | Freq: Once | INTRAMUSCULAR | Status: DC
Start: 2017-03-10 — End: 2017-03-10

## 2017-03-10 MED ORDER — LORAZEPAM 2 MG/ML IJ SOLN: 2.0000 mg | Freq: Once | INTRAMUSCULAR | Status: DC

## 2017-03-10 MED ORDER — HALOPERIDOL 5 MG PO TABS
ORAL_TABLET | ORAL | Status: AC
  Administered 2017-03-11: 5 mg
  Filled 2017-03-10: qty 1
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
## 2017-03-10 NOTE — ED Notes (Signed)
Pt arrived to ED per [employer] officer. Per officer pt called due to having SI thought this evening. Pt in triage rambling with words, not making since. Pt asked why he called 911, pt refusing to tell RN and getting mad that this RN is asking him questions. Pt sts, "I called cause I was having thoughts of hurting myself and that's all, now I don't want to talk no more." Pt asked to remove coat for BP check and pt excalating stating, "Man, just take me back where I came from. Im voluntary anyway, I anit go to stay and deal with this shit and this bitch." Pt standing up to leave with officer and sts, "I should have just killed myself, that what I'll do." Per this RN, pt was informed that he would now have IVC papers taken out due to safety concerns and him voicing such SI. Pt escalating and starting to cuss and raise voice. [employer] placing forensic restraints on pt and pt taken to RM 20 for further evaluation and completion of triage.

## 2017-03-11 ENCOUNTER — Other Ambulatory Visit: Payer: Self-pay

## 2017-03-11 DIAGNOSIS — F1994 Other psychoactive substance use, unspecified with psychoactive substance-induced mood disorder: Secondary | ICD-10-CM

## 2017-03-11 LAB — CBC
HCT: 41.9 % (ref 40.0–52.0)
Hemoglobin: 14.2 g/dL (ref 13.0–18.0)
MCH: 33.6 pg (ref 26.0–34.0)
MCHC: 33.9 g/dL (ref 32.0–36.0)
MCV: 99.2 fL (ref 80.0–100.0)
PLATELETS: 233 10*3/uL (ref 150–440)
RBC: 4.22 MIL/uL — AB (ref 4.40–5.90)
RDW: 12.2 % (ref 11.5–14.5)
WBC: 4.5 10*3/uL (ref 3.8–10.6)

## 2017-03-11 LAB — COMPREHENSIVE METABOLIC PANEL
ALT: 15 U/L — ABNORMAL LOW (ref 17–63)
AST: 30 U/L (ref 15–41)
Albumin: 4.1 g/dL (ref 3.5–5.0)
Alkaline Phosphatase: 51 U/L (ref 38–126)
Anion gap: 12 (ref 5–15)
BILIRUBIN TOTAL: 0.6 mg/dL (ref 0.3–1.2)
BUN: 10 mg/dL (ref 6–20)
CHLORIDE: 104 mmol/L (ref 101–111)
CO2: 26 mmol/L (ref 22–32)
CREATININE: 0.81 mg/dL (ref 0.61–1.24)
Calcium: 9.5 mg/dL (ref 8.9–10.3)
Glucose, Bld: 86 mg/dL (ref 65–99)
POTASSIUM: 3.4 mmol/L — AB (ref 3.5–5.1)
Sodium: 142 mmol/L (ref 135–145)
TOTAL PROTEIN: 8.1 g/dL (ref 6.5–8.1)

## 2017-03-11 LAB — SALICYLATE LEVEL

## 2017-03-11 LAB — ETHANOL: ALCOHOL ETHYL (B): 184 mg/dL — AB (ref <10)

## 2017-03-11 LAB — ACETAMINOPHEN LEVEL: Acetaminophen (Tylenol), Serum: <10 ug/mL — ABNORMAL LOW (ref 10–30)
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
## 2017-03-11 NOTE — ED Notes (Addendum)
Pt has clothing in 3 bags-2 pairs jeans, 1 pair gray and black sweat pants, 1 white long sleeve shirt, 2 pull over hoodies, 1 zip jacket. 1 pair white socks, 1 black belt, 1 pair white shoes   Pt wallet, 2 lighters, 2 flip phones, loose change less than $1, razor, body spray, deodorant, chapstick, pen, headphones, cd player in separate bag

## 2017-03-11 NOTE — ED Notes (Signed)
Pt ate lunch and took shower prior to discharge. All 4 bags of belongings returned to pt

## 2017-03-11 NOTE — Discharge Instructions (Signed)
You have been seen in the Emergency Department (ED)  today for a psychiatric complaint.  You have been evaluated by psychiatry and we believe you are safe to be discharged from the hospital.   ° °Please return to the Emergency Department (ED)  immediately if you have ANY thoughts of hurting yourself or anyone else, so that we may help you. ° °Please avoid alcohol and drug use. ° °Follow up with your doctor and/or therapist as soon as possible regarding today's ED  visit.  ° °You may call crisis hotline for [place] at [phone]. ° °

## 2017-03-11 NOTE — ED Notes (Addendum)
Pt brought to room 20 in handcuffs by [employer]. Pt loudly mumbling to self as escorted to room. Once in room, [employer] officer made statement to pt that initiated pt to be irate. Pt states "I came here to get helo like I was told to do and that bitch ass mother fucker gonna say some stupid shit like that because he don't like me. Not all black people look the same. He don't know me. [facility]."   Pt deescalated when [employer]DS officer removed from room and [employer] and this RN talk with pt. Pt refuses IM medications, but agrees to PO meds to help "calm him down and get some sleep". Pt agrees to dress out, vitals and phlebotomy. This RN brought pt sprite, sandwich tray and TV remote.   After approx 20 minutes, pt is calm and resting in bed. [employer] has left the room.

## 2017-03-11 NOTE — ED Provider Notes (Signed)
-----------------------------------------   12:58 PM on 03/11/2017 -----------------------------------------   Blood pressure 111/71, pulse 65, temperature 98.5 F (36.9 C), temperature source Oral, resp. rate 19, SpO2 97 %.  Patient has been evaluated by psychiatry and cleared for discharge. IVC lifted by Dr. Toni Amendlapacs. Patient's labs have been reviewed with no acute findings. Patient will be discharged at this time to home    Don PerkingVeronese, [place], MD 03/11/17 1258
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
## 2017-04-11 ENCOUNTER — Emergency Department
Admission: EM | Admit: 2017-04-11 | Discharge: 2017-04-11 | Disposition: A | Discharge status: Home / Self Care | Payer: Medicaid Other | Attending: Emergency Medicine | Admitting: Emergency Medicine

## 2017-04-11 ENCOUNTER — Encounter: Payer: Self-pay | Admitting: Emergency Medicine

## 2017-04-11 DIAGNOSIS — K625 Hemorrhage of anus and rectum: Secondary | ICD-10-CM | POA: Insufficient documentation

## 2017-04-11 DIAGNOSIS — Z5321 Procedure and treatment not carried out due to patient leaving prior to being seen by health care provider: Secondary | ICD-10-CM | POA: Insufficient documentation

## 2017-04-11 LAB — COMPREHENSIVE METABOLIC PANEL
ALK PHOS: 55 U/L (ref 38–126)
ALT: 13 U/L — ABNORMAL LOW (ref 17–63)
ANION GAP: 9 (ref 5–15)
AST: 29 U/L (ref 15–41)
Albumin: 3.9 g/dL (ref 3.5–5.0)
BILIRUBIN TOTAL: 0.8 mg/dL (ref 0.3–1.2)
BUN: 7 mg/dL (ref 6–20)
CALCIUM: 8.8 mg/dL — AB (ref 8.9–10.3)
CO2: 28 mmol/L (ref 22–32)
Chloride: 108 mmol/L (ref 101–111)
Creatinine, Ser: 0.92 mg/dL (ref 0.61–1.24)
GFR calc Af Amer: >60 mL/min (ref >60)
GLUCOSE: 89 mg/dL (ref 65–99)
POTASSIUM: 3 mmol/L — AB (ref 3.5–5.1)
Sodium: 145 mmol/L (ref 135–145)
TOTAL PROTEIN: 7.5 g/dL (ref 6.5–8.1)

## 2017-04-11 LAB — CBC
HEMATOCRIT: 39 % — AB (ref 40.0–52.0)
HEMOGLOBIN: 13.4 g/dL (ref 13.0–18.0)
MCH: 33.9 pg (ref 26.0–34.0)
MCHC: 34.3 g/dL (ref 32.0–36.0)
MCV: 98.6 fL (ref 80.0–100.0)
Platelets: 253 10*3/uL (ref 150–440)
RBC: 3.96 MIL/uL — ABNORMAL LOW (ref 4.40–5.90)
RDW: 12.3 % (ref 11.5–14.5)
WBC: 5.4 10*3/uL (ref 3.8–10.6)

## 2017-04-11 NOTE — ED Triage Notes (Signed)
Patient with complaint of rectal pain and bleeding with wiping times 4 days. Patient states that he has had 2 previous surgeries on his rectum.

## 2017-04-11 NOTE — ED Notes (Signed)
Pt states that there is just too much going on in the ER today and he just doesn't want to wait any longer, pt has called his support group multiple times while waiting in the lobby, pt speaking with Trey PaulaJeff from his support group who told this RN that he would be here in 15 min to get him

## 2017-08-26 IMAGING — CT CT PELVIS W/ CM
2 of 3 series · 17 of 46 positions shown, 19 images · IV contrast (iopamidol)
Comparison: None.

CLINICAL DATA: Rectal and scrotal pain for 3 days. History of anal
fistula.

EXAM:
CT PELVIS WITH CONTRAST
TECHNIQUE: Multidetector CT imaging of the pelvis was performed using the
standard protocol following the bolus administration of intravenous
contrast.
CONTRAST:  100 ml X1BAS4-FQQ IOPAMIDOL (X1BAS4-FQQ) INJECTION 61%

[Series 2: routine abd/pel with · axial · 0.73mm/px · z∈[-419,-149]mm · 14 of 63 slices shown, 16 images]
[im 5/63  soft-tissue]
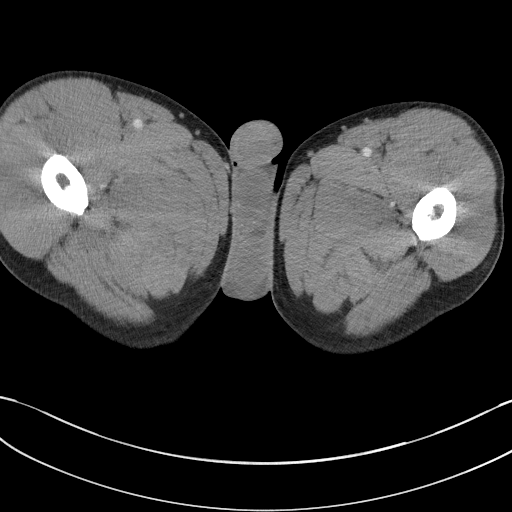
[im 5/63  bone]
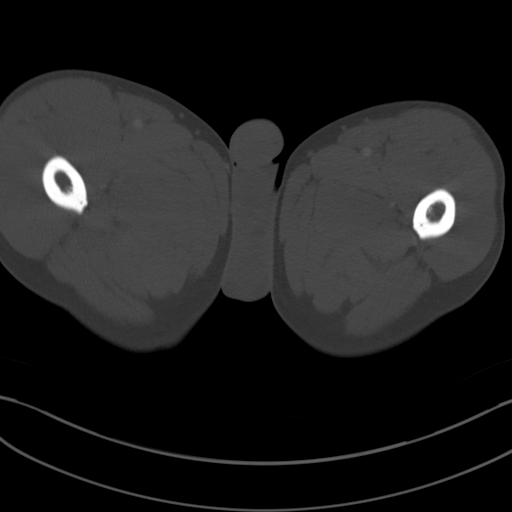
[im 9/63  soft-tissue]
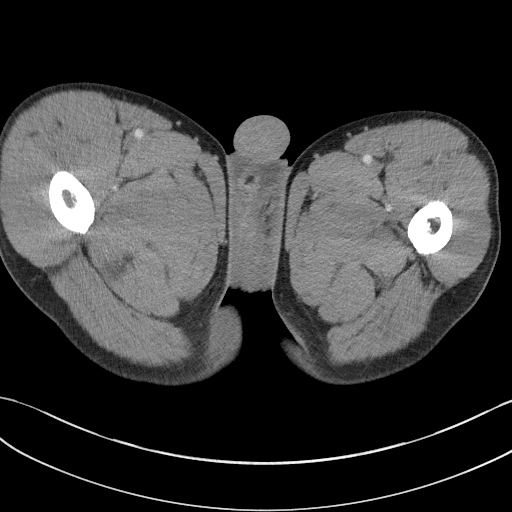
[im 13/63  soft-tissue]
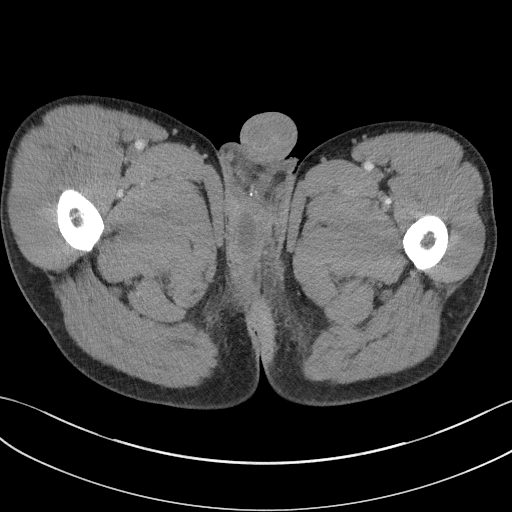
[im 17/63  soft-tissue]
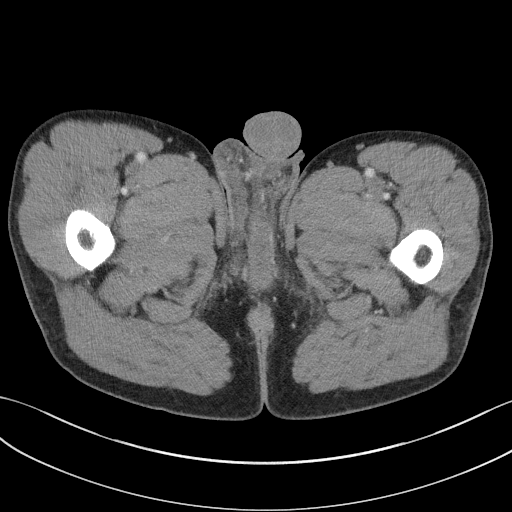
[im 21/63  soft-tissue]
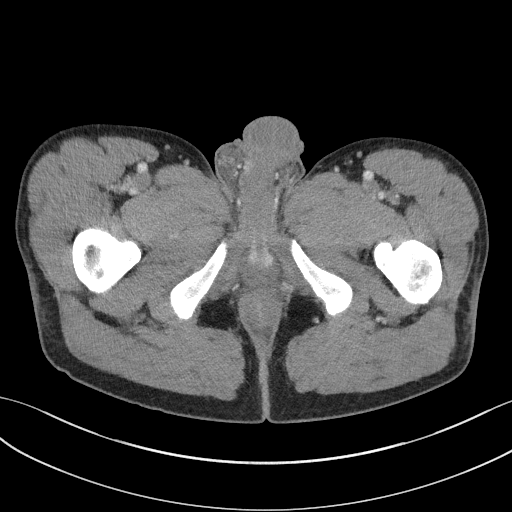
[im 25/63  soft-tissue]
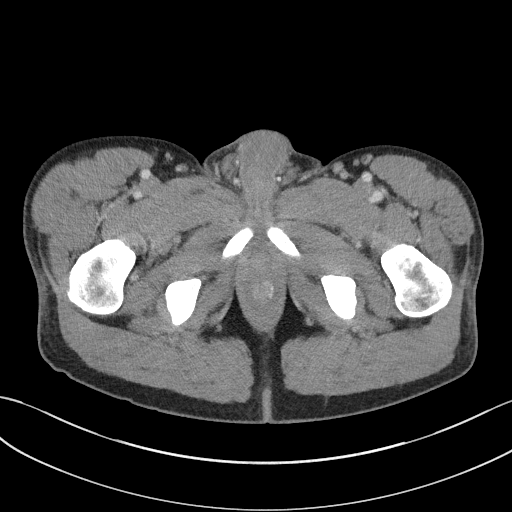
[im 29/63  soft-tissue]
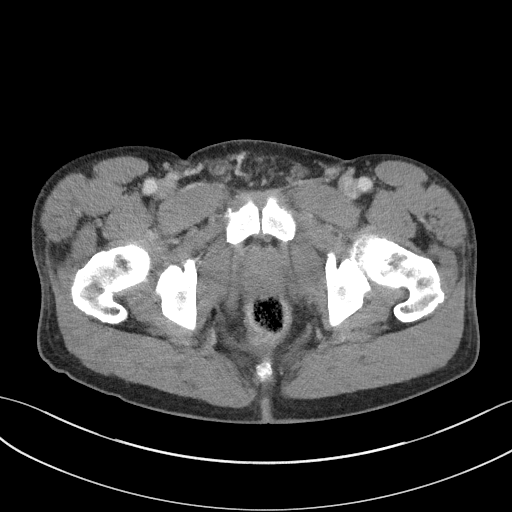
[im 35/63  soft-tissue]
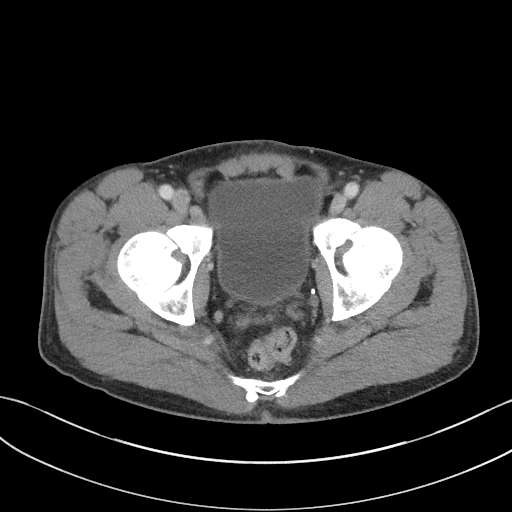
[im 39/63  soft-tissue]
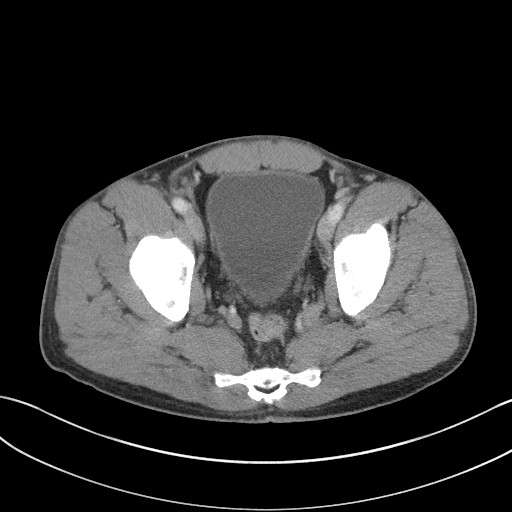
[im 39/63  bone]
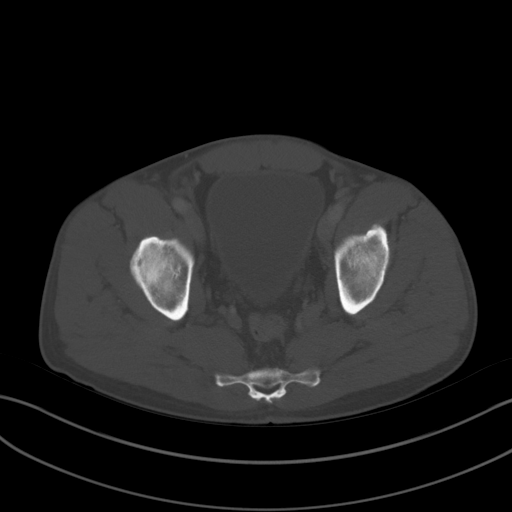
[im 43/63  soft-tissue]
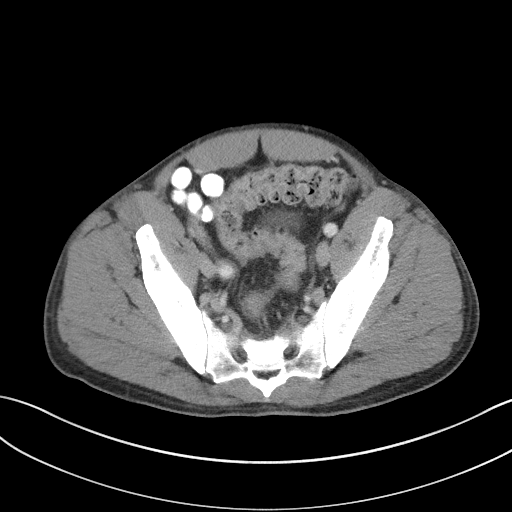
[im 47/63  soft-tissue]
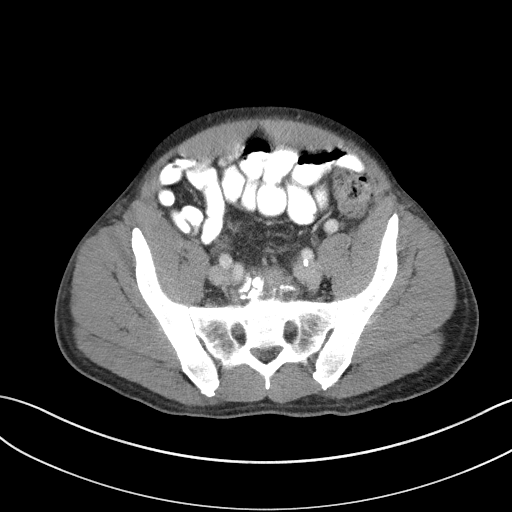
[im 51/63  soft-tissue]
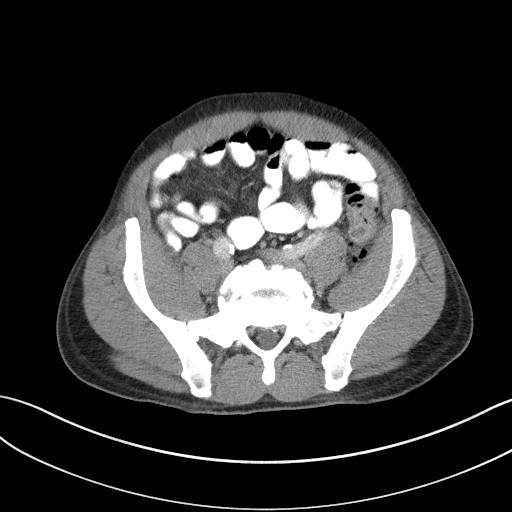
[im 55/63  soft-tissue]
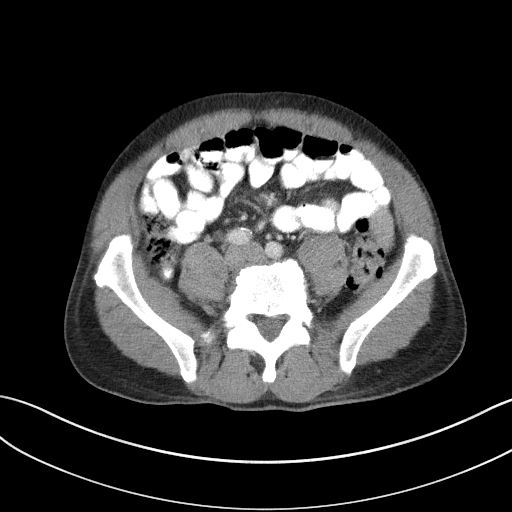
[im 59/63  soft-tissue]
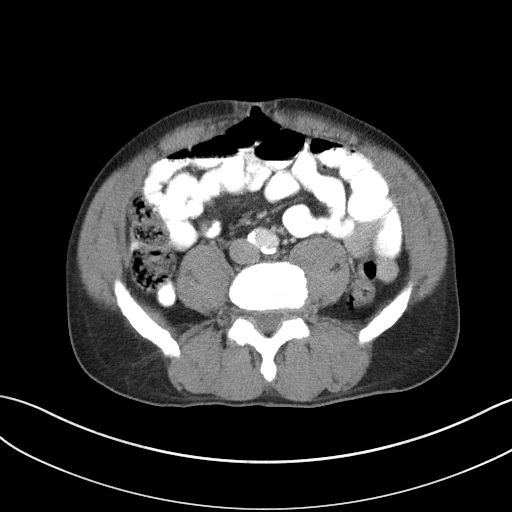

[Series 4: coronal st · coronal · 0.62mm/px · 3 of 75 slices shown]
[im 25/75  soft-tissue]
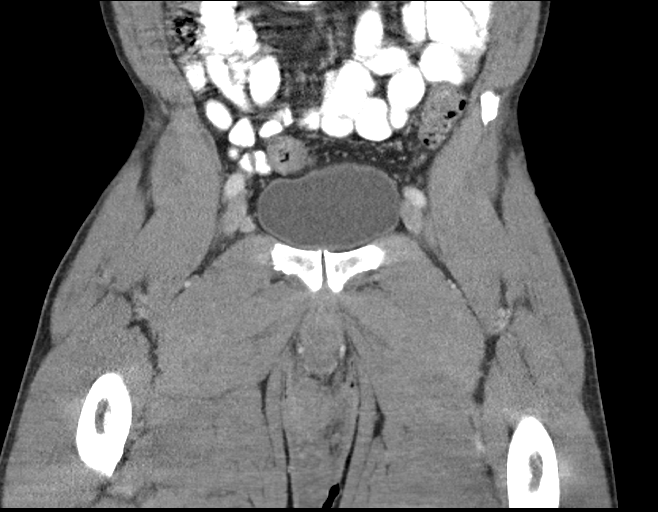
[im 33/75  soft-tissue]
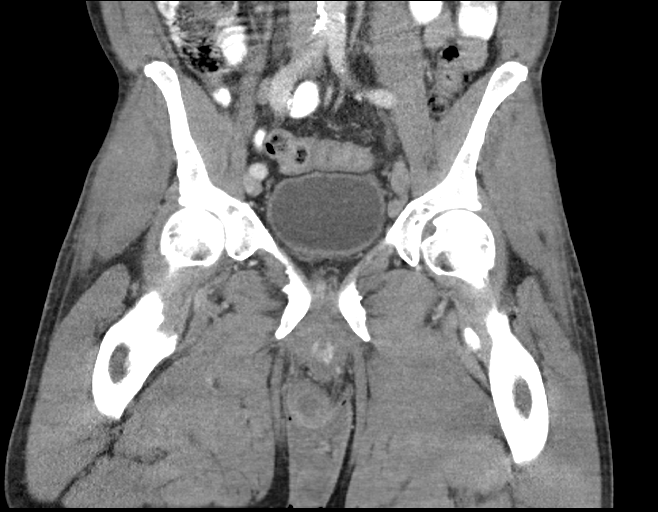
[im 42/75  soft-tissue]
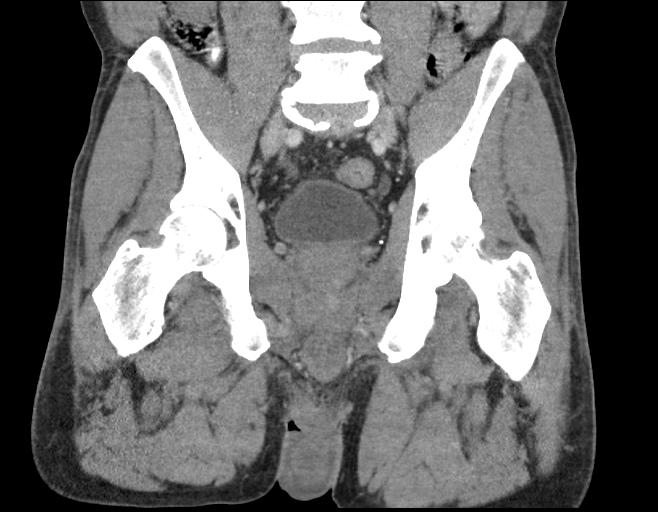

[17 of 46 positions shown; findings below may reference images not displayed]

FINDINGS: Urinary Tract:  Negative.

Bowel:  Negative.

Vascular/Lymphatic: Negative.

Reproductive:  Negative.

Other: There is stranding in subcutaneous fat of the perineum. A rim
enhancing fluid collection measuring 3.2 cm AP x 1.5 cm transverse x
1.8 cm craniocaudal is seen in subcutaneous tissues inferior to the
base of the penis to the right of midline and is consistent with an
abscess.

Musculoskeletal: Negative.
IMPRESSION: Findings consistent with cellulitis about the perineum with a
subcutaneous abscess to the right of midline at the base the penis
as described above.

## 2017-10-16 ENCOUNTER — Emergency Department
Admission: EM | Admit: 2017-10-16 | Discharge: 2017-10-16 | Disposition: A | Discharge status: Home / Self Care | Payer: Medicaid Other | Attending: Emergency Medicine | Admitting: Emergency Medicine

## 2017-10-16 ENCOUNTER — Other Ambulatory Visit: Payer: Self-pay

## 2017-10-16 DIAGNOSIS — B353 Tinea pedis: Secondary | ICD-10-CM | POA: Insufficient documentation

## 2017-10-16 DIAGNOSIS — M79672 Pain in left foot: Secondary | ICD-10-CM | POA: Diagnosis present

## 2017-10-16 DIAGNOSIS — F17210 Nicotine dependence, cigarettes, uncomplicated: Secondary | ICD-10-CM | POA: Diagnosis not present

## 2017-10-16 DIAGNOSIS — R21 Rash and other nonspecific skin eruption: Secondary | ICD-10-CM | POA: Insufficient documentation

## 2017-10-16 DIAGNOSIS — F1210 Cannabis abuse, uncomplicated: Secondary | ICD-10-CM | POA: Insufficient documentation

## 2017-10-16 MED ORDER — CICLOPIROX 0.77 % EX GEL: 1.0000 "application " | Freq: Two times a day (BID) | CUTANEOUS | 0 refills | Status: AC

## 2017-10-16 NOTE — ED Triage Notes (Signed)
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 Pt eating chips in triage, reports released June 5th and was supposed to get rechecked but hasn't. Pt denies diabetes

## 2017-10-16 NOTE — ED Notes (Signed)
Charge RN note: Pt with left foot pain for months, worse 3 days, VSS.  Pt to lobby via EMS.

## 2017-10-16 NOTE — ED Notes (Signed)
Pt ambulatory upon discharge. Verbalized understanding of discharge, follow-up care and prescription. VSS. Skin warm and dry. A&O x4.
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
## 2018-04-30 ENCOUNTER — Other Ambulatory Visit: Payer: Self-pay

## 2018-04-30 ENCOUNTER — Encounter: Payer: Self-pay | Admitting: Emergency Medicine

## 2018-04-30 ENCOUNTER — Emergency Department
Admission: EM | Admit: 2018-04-30 | Discharge: 2018-04-30 | Disposition: A | Discharge status: Home / Self Care | Payer: Medicaid Other | Attending: Emergency Medicine | Admitting: Emergency Medicine

## 2018-04-30 ENCOUNTER — Emergency Department: Payer: Medicaid Other

## 2018-04-30 DIAGNOSIS — S2241XA Multiple fractures of ribs, right side, initial encounter for closed fracture: Secondary | ICD-10-CM | POA: Insufficient documentation

## 2018-04-30 DIAGNOSIS — Y999 Unspecified external cause status: Secondary | ICD-10-CM | POA: Insufficient documentation

## 2018-04-30 DIAGNOSIS — F17210 Nicotine dependence, cigarettes, uncomplicated: Secondary | ICD-10-CM | POA: Diagnosis not present

## 2018-04-30 DIAGNOSIS — Y939 Activity, unspecified: Secondary | ICD-10-CM | POA: Diagnosis not present

## 2018-04-30 DIAGNOSIS — S299XXA Unspecified injury of thorax, initial encounter: Secondary | ICD-10-CM | POA: Diagnosis present

## 2018-04-30 DIAGNOSIS — Y929 Unspecified place or not applicable: Secondary | ICD-10-CM | POA: Diagnosis not present

## 2018-04-30 MED ORDER — NAPROXEN 500 MG PO TABS: 500.0000 mg | ORAL_TABLET | Freq: Two times a day (BID) | ORAL | Status: AC

## 2018-04-30 MED ORDER — TRAMADOL HCL 50 MG PO TABS: 50.0000 mg | ORAL_TABLET | Freq: Two times a day (BID) | ORAL | 0 refills | Status: AC | PRN
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
## 2018-08-26 DEATH — deceased
